# Patient Record
Sex: Female | Born: 1960 | Race: White | Hispanic: No | Marital: Married | Smoking: Never smoker
Health system: Southern US, Community
[De-identification: ages and names within clinical notes are randomized; demographics above are authoritative.]

## PROBLEM LIST (undated history)

## (undated) DIAGNOSIS — K5792 Diverticulitis of intestine, part unspecified, without perforation or abscess without bleeding: Secondary | ICD-10-CM

## (undated) DIAGNOSIS — E7800 Pure hypercholesterolemia, unspecified: Secondary | ICD-10-CM

## (undated) DIAGNOSIS — Z8249 Family history of ischemic heart disease and other diseases of the circulatory system: Secondary | ICD-10-CM

## (undated) DIAGNOSIS — G43909 Migraine, unspecified, not intractable, without status migrainosus: Secondary | ICD-10-CM

## (undated) DIAGNOSIS — I10 Essential (primary) hypertension: Secondary | ICD-10-CM

## (undated) DIAGNOSIS — M1990 Unspecified osteoarthritis, unspecified site: Secondary | ICD-10-CM

## (undated) DIAGNOSIS — C801 Malignant (primary) neoplasm, unspecified: Secondary | ICD-10-CM

## (undated) HISTORY — PX: REFRACTIVE SURGERY: SHX103

## (undated) HISTORY — PX: OTHER SURGICAL HISTORY: SHX169

## (undated) HISTORY — DX: Family history of ischemic heart disease and other diseases of the circulatory system: Z82.49

## (undated) HISTORY — PX: HERNIA REPAIR: SHX51

## (undated) HISTORY — DX: Diverticulitis of intestine, part unspecified, without perforation or abscess without bleeding: K57.92

## (undated) HISTORY — DX: Migraine, unspecified, not intractable, without status migrainosus: G43.909

## (undated) HISTORY — DX: Pure hypercholesterolemia, unspecified: E78.00

## (undated) HISTORY — DX: Essential (primary) hypertension: I10

---

## 2000-05-31 ENCOUNTER — Ambulatory Visit (HOSPITAL_COMMUNITY): Admission: RE | Admit: 2000-05-31 | Discharge: 2000-05-31 | Payer: Self-pay | Admitting: Internal Medicine

## 2002-04-13 ENCOUNTER — Other Ambulatory Visit: Admission: RE | Admit: 2002-04-13 | Discharge: 2002-04-13 | Payer: Self-pay | Admitting: Obstetrics and Gynecology

## 2002-04-19 ENCOUNTER — Encounter: Admission: RE | Admit: 2002-04-19 | Discharge: 2002-04-19 | Payer: Self-pay | Admitting: Obstetrics and Gynecology

## 2002-04-19 ENCOUNTER — Encounter: Payer: Self-pay | Admitting: Obstetrics and Gynecology

## 2003-04-25 ENCOUNTER — Other Ambulatory Visit: Admission: RE | Admit: 2003-04-25 | Discharge: 2003-04-25 | Payer: Self-pay | Admitting: Obstetrics and Gynecology

## 2004-05-07 ENCOUNTER — Other Ambulatory Visit: Admission: RE | Admit: 2004-05-07 | Discharge: 2004-05-07 | Payer: Self-pay | Admitting: Obstetrics and Gynecology

## 2004-12-02 ENCOUNTER — Encounter (INDEPENDENT_AMBULATORY_CARE_PROVIDER_SITE_OTHER): Payer: Self-pay | Admitting: *Deleted

## 2004-12-02 ENCOUNTER — Ambulatory Visit (HOSPITAL_COMMUNITY): Admission: RE | Admit: 2004-12-02 | Discharge: 2004-12-02 | Payer: Self-pay | Admitting: Gastroenterology

## 2004-12-15 ENCOUNTER — Ambulatory Visit: Payer: Self-pay | Admitting: Internal Medicine

## 2005-04-15 ENCOUNTER — Encounter: Admission: RE | Admit: 2005-04-15 | Discharge: 2005-04-15 | Payer: Self-pay | Admitting: Sports Medicine

## 2005-09-23 ENCOUNTER — Other Ambulatory Visit: Admission: RE | Admit: 2005-09-23 | Discharge: 2005-09-23 | Payer: Self-pay | Admitting: Obstetrics and Gynecology

## 2005-10-30 ENCOUNTER — Ambulatory Visit (HOSPITAL_COMMUNITY): Admission: RE | Admit: 2005-10-30 | Discharge: 2005-10-30 | Payer: Self-pay | Admitting: Obstetrics and Gynecology

## 2005-10-30 ENCOUNTER — Encounter (INDEPENDENT_AMBULATORY_CARE_PROVIDER_SITE_OTHER): Payer: Self-pay | Admitting: Specialist

## 2005-11-24 ENCOUNTER — Ambulatory Visit: Payer: Self-pay | Admitting: Internal Medicine

## 2005-12-09 ENCOUNTER — Ambulatory Visit: Payer: Self-pay | Admitting: Internal Medicine

## 2005-12-10 ENCOUNTER — Ambulatory Visit: Payer: Self-pay | Admitting: Internal Medicine

## 2005-12-10 ENCOUNTER — Encounter: Admission: RE | Admit: 2005-12-10 | Discharge: 2005-12-10 | Payer: Self-pay | Admitting: Sports Medicine

## 2005-12-25 ENCOUNTER — Encounter: Payer: Self-pay | Admitting: Cardiology

## 2005-12-25 ENCOUNTER — Ambulatory Visit: Payer: Self-pay

## 2006-01-22 ENCOUNTER — Ambulatory Visit: Payer: Self-pay | Admitting: Internal Medicine

## 2006-05-13 ENCOUNTER — Ambulatory Visit: Payer: Self-pay | Admitting: Internal Medicine

## 2006-05-20 ENCOUNTER — Ambulatory Visit: Payer: Self-pay | Admitting: Internal Medicine

## 2006-05-25 ENCOUNTER — Ambulatory Visit: Payer: Self-pay | Admitting: Cardiovascular Disease

## 2007-02-21 ENCOUNTER — Ambulatory Visit: Payer: Self-pay | Admitting: Internal Medicine

## 2007-02-25 DIAGNOSIS — E7800 Pure hypercholesterolemia, unspecified: Secondary | ICD-10-CM

## 2007-06-23 ENCOUNTER — Ambulatory Visit: Payer: Self-pay | Admitting: Internal Medicine

## 2007-06-23 LAB — CONVERTED CEMR LAB
ALT: 25 units/L (ref 0–35)
AST: 25 units/L (ref 0–37)
Albumin: 4.4 g/dL (ref 3.5–5.2)
Alkaline Phosphatase: 58 units/L (ref 39–117)
BUN: 11 mg/dL (ref 6–23)
Basophils Absolute: 0 10*3/uL (ref 0.0–0.1)
Basophils Relative: 0.1 % (ref 0.0–1.0)
Bilirubin Urine: NEGATIVE
Bilirubin, Direct: 0.1 mg/dL (ref 0.0–0.3)
Blood in Urine, dipstick: NEGATIVE
CO2: 29 meq/L (ref 19–32)
Calcium: 9.6 mg/dL (ref 8.4–10.5)
Chloride: 107 meq/L (ref 96–112)
Cholesterol: 222 mg/dL (ref 0–200)
Creatinine, Ser: 0.8 mg/dL (ref 0.4–1.2)
Direct LDL: 180.5 mg/dL
Eosinophils Absolute: 0.1 10*3/uL (ref 0.0–0.6)
Eosinophils Relative: 0.8 % (ref 0.0–5.0)
GFR calc Af Amer: 98 mL/min
GFR calc non Af Amer: 81 mL/min
Glucose, Bld: 104 mg/dL — ABNORMAL HIGH (ref 70–99)
Glucose, Urine, Semiquant: NEGATIVE
HCT: 40.1 % (ref 36.0–46.0)
HDL: 29.1 mg/dL — ABNORMAL LOW (ref >39.0)
Hemoglobin: 14 g/dL (ref 12.0–15.0)
Ketones, urine, test strip: NEGATIVE
Lymphocytes Relative: 32.9 % (ref 12.0–46.0)
MCHC: 34.8 g/dL (ref 30.0–36.0)
MCV: 86.9 fL (ref 78.0–100.0)
Monocytes Absolute: 0.6 10*3/uL (ref 0.2–0.7)
Monocytes Relative: 8.7 % (ref 3.0–11.0)
Neutro Abs: 3.9 10*3/uL (ref 1.4–7.7)
Neutrophils Relative %: 57.5 % (ref 43.0–77.0)
Nitrite: NEGATIVE
Platelets: 225 10*3/uL (ref 150–400)
Potassium: 4.1 meq/L (ref 3.5–5.1)
RBC: 4.62 M/uL (ref 3.87–5.11)
RDW: 11.9 % (ref 11.5–14.6)
Sodium: 142 meq/L (ref 135–145)
Specific Gravity, Urine: 1.025
TSH: 1.63 microintl units/mL (ref 0.35–5.50)
Total Bilirubin: 0.7 mg/dL (ref 0.3–1.2)
Total CHOL/HDL Ratio: 7.6
Total Protein: 6.8 g/dL (ref 6.0–8.3)
Triglycerides: 119 mg/dL (ref 0–149)
Urobilinogen, UA: 0.2
VLDL: 24 mg/dL (ref 0–40)
WBC Urine, dipstick: NEGATIVE
WBC: 6.8 10*3/uL (ref 4.5–10.5)
pH: 5.5

## 2007-06-30 ENCOUNTER — Ambulatory Visit: Payer: Self-pay | Admitting: Internal Medicine

## 2007-06-30 DIAGNOSIS — G43709 Chronic migraine without aura, not intractable, without status migrainosus: Secondary | ICD-10-CM | POA: Insufficient documentation

## 2007-06-30 DIAGNOSIS — G43009 Migraine without aura, not intractable, without status migrainosus: Secondary | ICD-10-CM | POA: Insufficient documentation

## 2007-07-05 DIAGNOSIS — M25469 Effusion, unspecified knee: Secondary | ICD-10-CM | POA: Insufficient documentation

## 2007-07-08 ENCOUNTER — Ambulatory Visit: Payer: Self-pay | Admitting: Family Medicine

## 2007-09-08 ENCOUNTER — Telehealth: Payer: Self-pay | Admitting: Internal Medicine

## 2007-10-28 ENCOUNTER — Ambulatory Visit: Payer: Self-pay | Admitting: Internal Medicine

## 2007-10-28 DIAGNOSIS — J019 Acute sinusitis, unspecified: Secondary | ICD-10-CM | POA: Insufficient documentation

## 2007-11-07 ENCOUNTER — Telehealth: Payer: Self-pay | Admitting: Internal Medicine

## 2007-12-01 ENCOUNTER — Encounter: Admission: RE | Admit: 2007-12-01 | Discharge: 2007-12-01 | Payer: Self-pay | Admitting: Internal Medicine

## 2007-12-20 ENCOUNTER — Ambulatory Visit: Admission: RE | Admit: 2007-12-20 | Discharge: 2007-12-20 | Payer: Self-pay | Admitting: Internal Medicine

## 2008-12-31 ENCOUNTER — Ambulatory Visit (HOSPITAL_COMMUNITY): Admission: RE | Admit: 2008-12-31 | Discharge: 2008-12-31 | Payer: Self-pay | Admitting: Obstetrics and Gynecology

## 2010-05-21 ENCOUNTER — Encounter: Admission: RE | Admit: 2010-05-21 | Discharge: 2010-05-21 | Payer: Self-pay | Admitting: Neurology

## 2010-05-23 ENCOUNTER — Encounter: Admission: RE | Admit: 2010-05-23 | Discharge: 2010-05-23 | Payer: Self-pay | Admitting: Neurology

## 2010-06-10 ENCOUNTER — Encounter: Admission: RE | Admit: 2010-06-10 | Discharge: 2010-06-10 | Payer: Self-pay | Admitting: Internal Medicine

## 2010-06-16 ENCOUNTER — Encounter: Admission: RE | Admit: 2010-06-16 | Discharge: 2010-06-16 | Payer: Self-pay | Admitting: Internal Medicine

## 2010-07-01 ENCOUNTER — Other Ambulatory Visit: Admission: RE | Admit: 2010-07-01 | Discharge: 2010-07-01 | Payer: Self-pay | Admitting: Interventional Radiology

## 2010-07-01 ENCOUNTER — Encounter: Admission: RE | Admit: 2010-07-01 | Discharge: 2010-07-01 | Payer: Self-pay | Admitting: Endocrinology

## 2010-09-21 ENCOUNTER — Encounter: Payer: Self-pay | Admitting: Neurology

## 2010-12-09 LAB — CBC
HCT: 41.1 % (ref 36.0–46.0)
Hemoglobin: 14 g/dL (ref 12.0–15.0)
MCHC: 34.1 g/dL (ref 30.0–36.0)
MCV: 88.8 fL (ref 78.0–100.0)
RDW: 13.2 % (ref 11.5–15.5)

## 2010-12-17 ENCOUNTER — Other Ambulatory Visit: Payer: Self-pay | Admitting: Endocrinology

## 2010-12-17 DIAGNOSIS — E049 Nontoxic goiter, unspecified: Secondary | ICD-10-CM

## 2010-12-22 ENCOUNTER — Ambulatory Visit
Admission: RE | Admit: 2010-12-22 | Discharge: 2010-12-22 | Disposition: A | Discharge status: Home / Self Care | Payer: BC Managed Care – PPO | Source: Ambulatory Visit | Attending: Endocrinology | Admitting: Endocrinology

## 2010-12-22 DIAGNOSIS — E049 Nontoxic goiter, unspecified: Secondary | ICD-10-CM

## 2011-01-13 NOTE — Op Note (Signed)
NAMEGRACELYN, Sherry Fernandez                 ACCOUNT NO.:  [id number]   MEDICAL RECORD NO.:  [id number]          PATIENT TYPE:  AMB   LOCATION:  SDC                           FACILITY:  [facility]   PHYSICIAN:  Duke Salvia. Marcelle Overlie, M.D.DATE OF BIRTH:  [birth year]   DATE OF PROCEDURE:  DATE OF DISCHARGE:                               OPERATIVE REPORT   PREOPERATIVE DIAGNOSIS:  Stress urinary incontinence.   POSTOPERATIVE DIAGNOSIS:  Stress urinary incontinence.   PROCEDURE:  Solyx single incision, mid urethral sling, cystoscopy.   SURGEON:  Duke Salvia. Marcelle Overlie, MD   ANESTHESIA:  General.   COMPLICATIONS:  None.   DRAINS:  Foley catheter.   SPECIMENS:  None.   BLOOD LOSS:  Less than 50 mL.   PROCEDURE AND FINDINGS:  The patient was taken to the operating room.  After an adequate level of general anesthesia was obtained with the  patient's legs in stirrups, the perineum and vagina were prepped and  draped in usual manner for vaginal procedures.  In-and-out cath  performed.  Weighted speculum was positioned.  The mid urethral area was  identified, infiltrated with 1% Xylocaine local into the area of  dissection.  A 2.5-cm vertical mid urethral incision was made.  This was  grasped with Allis clamps on either side.  Minimal sharp dissection and  then blunt dissection with the surgeon's finger until the posterior side  of the inferior pubic ramus could be palpated on each side.  The Solyx  SIS was then loaded at a 45-degree angle, was positioned to the midpoint  behind the inferior pubic ramus on her right.  This was then anchored.  The exact same repeated on the opposite side.  Prior to disengaging the  anchor, the tension was checked for the appropriate level and the anchor  was disengaged.  Incision was closed with interrupted 2-0 Vicryl  sutures.  Cystoscopy was then carried out with a 70-degrees revealing  intact bladder and urethra.  Vaginal pack was positioned.  Foley  catheter positioned  draining clear urine.  She tolerated this well, went  to recovery room in good condition.       Richard M. Marcelle Overlie, M.D.  Electronically Signed    RMH/MEDQ  D:  12/31/2008  T:  12/31/2008  Job:  [id number]

## 2011-01-13 NOTE — H&P (Signed)
Sherry Fernandez, Sherry Fernandez                 ACCOUNT NO.:  [id number]   MEDICAL RECORD NO.:  [id number]        PATIENT TYPE:  WAMB   LOCATION:                                FACILITY:  [facility]   PHYSICIAN:  Duke Salvia. Marcelle Overlie, M.D.DATE OF BIRTH:  1960/12/24   DATE OF ADMISSION:  12/31/2008  DATE OF DISCHARGE:                              HISTORY & PHYSICAL   CHIEF COMPLAINT:  Stress urinary incontinence.   HISTORY OF PRESENT ILLNESS:  A 50 year old G3, P3, partner had a  vasectomy with a history of leaking urine with exercise, coughing,  laughing, sneezing.   Urodynamic evaluation in our office demonstrated a PVR 25 mL.  Her leak  point was 99-134.  She definitely had leaking with a full bladder.  The  Solyx SIS procedure reviewed with her including risks related to  bleeding, infection, mesh erosion, the possible need for postop  catheterization, bladder irritability.  Symptoms all reviewed with her  which she understands and accepts.   PAST MEDICAL HISTORY:  Allergies to SULFA.   OPERATIONS:  Vaginal delivery x3, herniorrhaphy 1983, endometrial  ablation 2007, and endoscopy March 2009.   REVIEW OF SYSTEMS:  Significant for history of headache.   FAMILY HISTORY:  Otherwise unremarkable.   SOCIAL HISTORY:  Denies smoking or drug use, 0-2 drinks per day for  alcohol use.  Her primary care physician is Dr. Merri Brunette.   PHYSICAL EXAMINATION:  VITAL SIGNS:  Temperature 92, blood pressure  120/78.  HEENT:  Unremarkable.  NECK:  Supple without masses.  LUNGS:  Clear.  CARDIOVASCULAR:  Regular rate and rhythm without murmurs, rubs, or  gallops.  BREASTS:  Without masses.  ABDOMEN:  Soft, flat, nontender.  PELVIC:  Normal external genitalia.  High vaginal swab is clear.  Uterus  is normal in positional size.  Adnexa negative.  EXTREMITIES:  Unremarkable.  NEUROLOGIC:  Unremarkable.   IMPRESSION:  Stress urinary incontinence.   PLAN:  Solyx Single Incision Sling.  Procedure and risks  reviewed as  above.      Richard M. Marcelle Overlie, M.D.  Electronically Signed     RMH/MEDQ  D:  12/25/2008  T:  12/26/2008  Job:  [id number]

## 2011-01-16 NOTE — Op Note (Signed)
NAMEKEMIAH, BOOZ                 ACCOUNT NO.:  [id number]   MEDICAL RECORD NO.:  [id number]          PATIENT TYPE:  AMB   LOCATION:  SDC                           FACILITY:  [facility]   PHYSICIAN:  Duke Salvia. Marcelle Overlie, M.D.DATE OF BIRTH:  1960/12/22   DATE OF PROCEDURE:  10/30/2005  DATE OF DISCHARGE:                                 OPERATIVE REPORT   PREOPERATIVE DIAGNOSES:  Endometrial polyp, abnormal uterine bleeding.   POSTOPERATIVE DIAGNOSES:  Endometrial polyp, abnormal uterine bleeding.   PROCEDURE:  D&C hysteroscopy with NovaSure endometrial ablation.   SPECIMENS:  Endometrial curetting's.   ESTIMATED BLOOD LOSS:  Minimal.   SURGEON:  Duke Salvia. Marcelle Overlie, M.D.   DESCRIPTION OF PROCEDURE:  The patient was taken to the operating room and  after an adequate level of general endotracheal anesthesia was obtained with  the legs in stirrups, the perineum and vagina were prepped and draped in the  usual manner for vaginal procedures. The bladder was drained, EUA carried  out, the uterus mid position, normal size, adnexa negative. The speculum was  positioned, cervix grasped with a tenaculum, paracervical block created by  infiltrating at 3 and 9 o'clock submucosally 5-7 mL of 1% Xylocaine on  either side after negative aspiration. The uterus was sounded to 8 cm with a  cervical length of 2.5, progressively dilated to a 29 Pratt dilator. A polyp  forceps used to explore the cavity revealing 1 polypoid mass, a D&C was  carried out, minimal tissue sent for pathology. The scope was inserted, the  cavity was irrigated, noted to otherwise have a normal contour. After this  was noted, the NovaSure procedure was carried out per protocol after passing  the CO2 test. Power of  121 for 68 seconds without any immediate  complications. She did receive IV Toradol at the end of the case and went to  the recovery room in good condition.      Richard M. Marcelle Overlie, M.D.  Electronically  Signed     RMH/MEDQ  D:  10/30/2005  T:  10/30/2005  Job:  [id number]

## 2011-01-16 NOTE — H&P (Signed)
Sherry Fernandez, Sherry Fernandez                 ACCOUNT NO.:  [id number]   MEDICAL RECORD NO.:  [id number]          PATIENT TYPE:  AMB   LOCATION:  SDC                           [facility]ILITY:  [facility]   PHYSICIAN:  Duke Salvia. Marcelle Overlie, M.D.DATE OF BIRTH:  [birth year]   DATE OF ADMISSION:  10/30/2005  DATE OF DISCHARGE:                                HISTORY & PHYSICAL   CHIEF COMPLAINT:  Menorrhagia.   HISTORY OF PRESENT ILLNESS:  50 year old G4, P3.  Her husband has had a  vasectomy.  She presents with menorrhagia.  Has had a prior HSG that showed  several very small fibroids and a possibility of an endometrial polyp.  She  presents now for [facility], hysteroscopy, and NovaSure EMA.  This procedure  including risks of bleeding, infection, adjacent organ injury, the possible  need for open or additional surgery all reviewed with her which she  understands and accepts.   PAST MEDICAL HISTORY:   ALLERGIES:  None.   OPERATIONS:  Prior hernia repair.   OBSTETRICAL HISTORY:  Three vaginal deliveries at term without complication  one of which she had gestational diabetes.   PHYSICAL EXAMINATION:  VITAL SIGNS:  Temperature 98.2, blood pressure 98/72.  HEENT:  Unremarkable.  NECK:  Supple without mass.  LUNGS:  Clear.  CARDIOVASCULAR:  Regular rate and rhythm without murmurs, rubs, or gallops  noted.  BREASTS:  Negative.  ABDOMEN:  Benign.  PELVIC:  Normal external genitalia, vagina and cervix clear.  Uterus mid  position, normal size.  Adnexa negative.  EXTREMITIES:  Unremarkable.  NEUROLOGIC:  Unremarkable.   IMPRESSION:  Menorrhagia.   PLAN:  D&C, hysteroscopy, NovaSure endometrial ablation.  Procedure and  risks reviewed as above.      Richard M. Marcelle Overlie, M.D.  Electronically Signed    RMH/MEDQ  D:  10/22/2005  T:  10/22/2005  Job:  [id number]

## 2011-01-16 NOTE — Assessment & Plan Note (Signed)
[facility]                                   ON-CALL NOTE   KINSIE, BELFORD                          MRN:          [id number]  DATE:05/19/2006                            DOB:          [birth year]    ON CALL NOTE:  8 p.m.  Caller is patient's husband [employer].  The phone  number [phone].   SUBJECTIVE:  Mr. Kunde states that his wife has been having severe headache  off and on for the past 10 days. She has a history of migraines.  She has an  appointment scheduled tomorrow with Dr. Cato Mulligan.  He states that she has had  a severe headache that returned this morning, accompanied by photo and  phonophobia.  She did have nausea and vomiting once in the previous few days  and now is just nauseous.  She states to him that she does not have any  numbness, slurred speech, visual change, weakness of any limbs.  She also  does not have any fever, chills, shortness of breath.  A short time ago she  took a hydrocodone and ibuprofen that she had been given previously.  Her  headache reduced from a 10/10 pain down to 5/10 pain.  Mr. Cannell main  question is whether she should be evaluated tonight at the emergency room or  wait until the appointment tomorrow.   PLAN:  The patient's question and the patient's husband's question was  discussed in detail.  We discussed the possibility of the need to go to  Urgent Care for further pain medication.  The patient was instructed through  the patient's husband that if any numbness, slurred speech, fever or severe  headache return to go to the emergency room for further evaluation tonight.  Otherwise she should keep her appointment tomorrow with Dr. Cato Mulligan.                                   Kerby Nora, MD   AB/MedQ  DD:  05/19/2006  DT:  05/21/2006  Job #:  [id number]   cc:   Valetta Mole. Swords, MD

## 2011-01-16 NOTE — Op Note (Signed)
Sherry Fernandez, CELLI                  ACCOUNT NO.:  [id number]   MEDICAL RECORD NO.:  [id number]          PATIENT TYPE:  AMB   LOCATION:  ENDO                         FACILITY:  [facility]   PHYSICIAN:  Petra Kuba, M.D.    DATE OF BIRTH:  [birth year]   DATE OF PROCEDURE:  12/02/2004  DATE OF DISCHARGE:                                 OPERATIVE REPORT   PROCEDURE:  Colonoscopy with biopsy.   INDICATIONS:  Change in bowel habits, almost due for colonic screening.  Long history of IBS.  Want to re-evaluate.  Consent was signed after risks,  benefits, methods, and options thoroughly discussed in the office.   MEDICATIONS:  Demerol 100 mg, Versed 10 mg.   PROCEDURE:  Rectal inspection was pertinent for external hemorrhoids, small.  Digital exam was negative.  The video pediatric adjustable colonoscope was  inserted and despite a tortuous sigmoid, was able to be advanced to the  cecum, which required abdominal pressure but no position changes.  No  abnormalities were seen on insertion.  The scope was inserted a short way  into the terminal ileum, which was normal.  Photo documentation was obtained  and the scope was slowly withdrawn.  Prep was adequate.  There was some  liquid stool that required washing and suctioning.  On slow withdrawal  through the colon, the cecum, ascending and transverse was normal, as was  the majority of the descending.  As the scope was withdrawn in the tortuous  sigmoid, a few tiny early diverticula were seen.  In the distal sigmoid, a  questionable tiny polyp was seen versus a normal fold, which was cold  biopsied x2.  No other abnormalities were seen as we slowly withdrew back to  the rectum.  Anorectal pull-through and retroflexion confirmed some small  hemorrhoids.  The scope was straightened and readvanced a short way up the  left side of the colon, air was suctioned, the scope removed.  The patient  tolerated the procedure well.  There was no obvious immediate  complication.   ENDOSCOPIC DIAGNOSES:  1.  Internal-external small hemorrhoids.  2.  Tortuous sigmoid.  3.  Early few left-sided diverticula.  4.  A normal sigmoid fold versus tiny polyp, cold biopsied.  5.  Otherwise within normal limits to the terminal ileum.   PLAN:  Await pathology.  Would follow up p.r.n. or in two months.  Continue  __________ since it seems to be helping.      MEM/MEDQ  D:  12/02/2004  T:  12/02/2004  Job:  [id number]   cc:   Valetta Mole. Swords, M.D. [facility]   Richard M. Marcelle Overlie, M.D.  [address] [place]  [place] [address]  Fax: [phone]

## 2011-06-05 IMAGING — US US CAROTID DUPLEX BILAT
1 series · 13 of 24 positions shown · non-contrast
Comparison: 05/23/2010 neck CT

CLINICAL DATA: Migraines, left vertebral narrowing at its origin

BILATERAL CAROTID DUPLEX ULTRASOUND
TECHNIQUE: Gray scale imaging, color Doppler and duplex ultrasound
was performed of bilateral carotid and vertebral arteries in the
neck.

[Series 1: us carotid duplex bilat · 0.07mm/px · 13 of 62 slices shown]
[im 1/62]
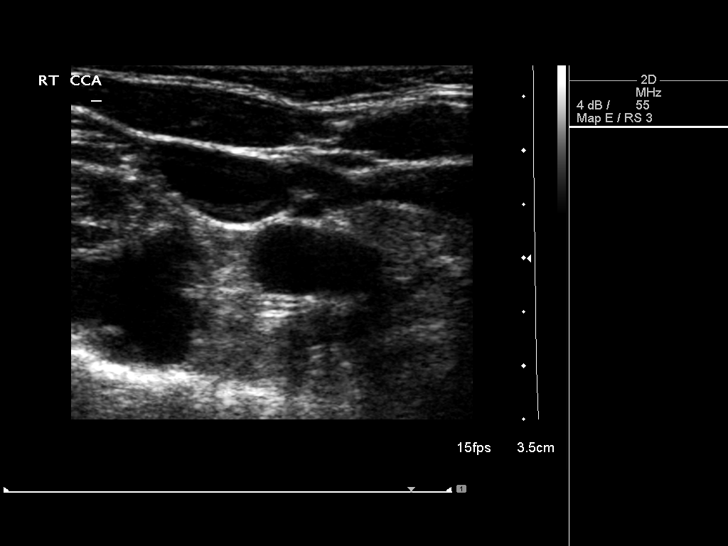
[im 6/62]
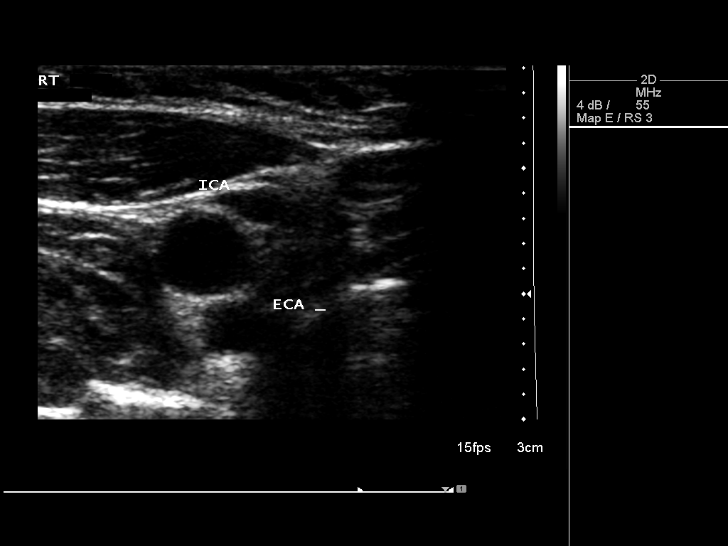
[im 11/62]
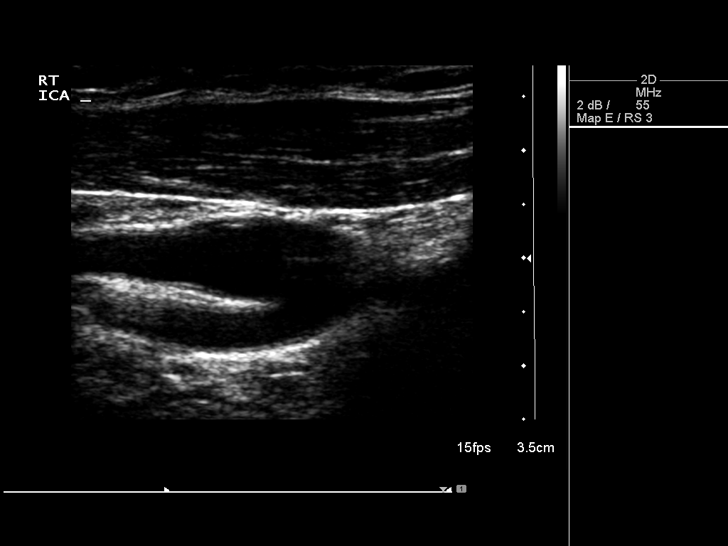
[im 16/62]
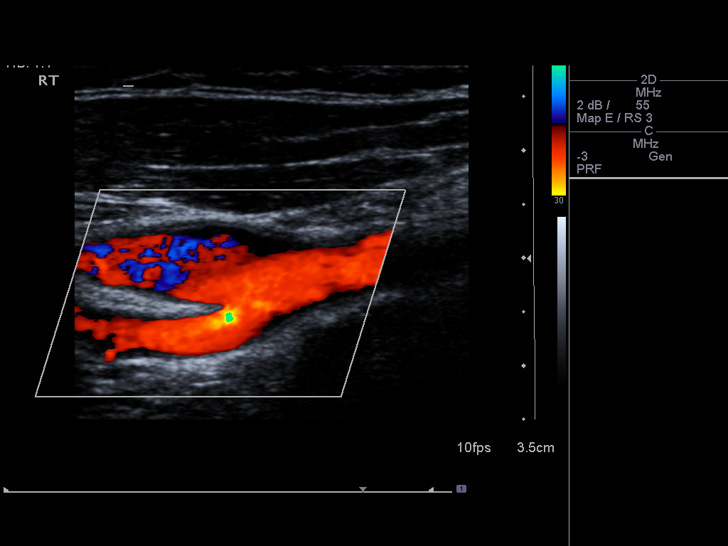
[im 22/62]
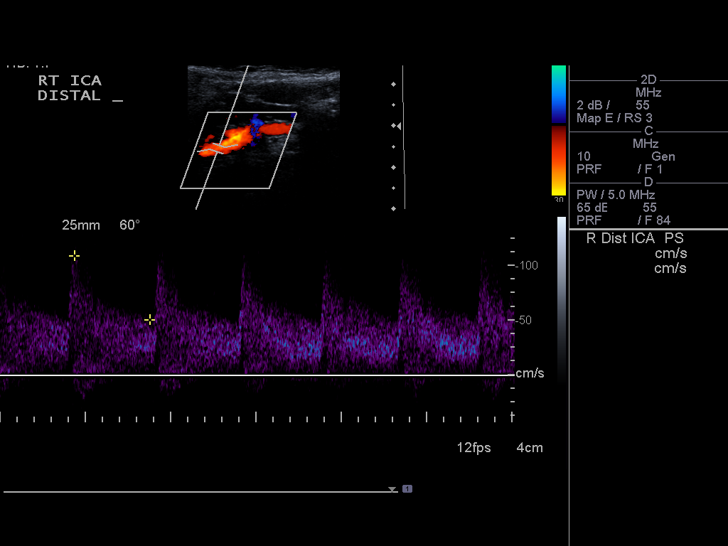
[im 27/62]
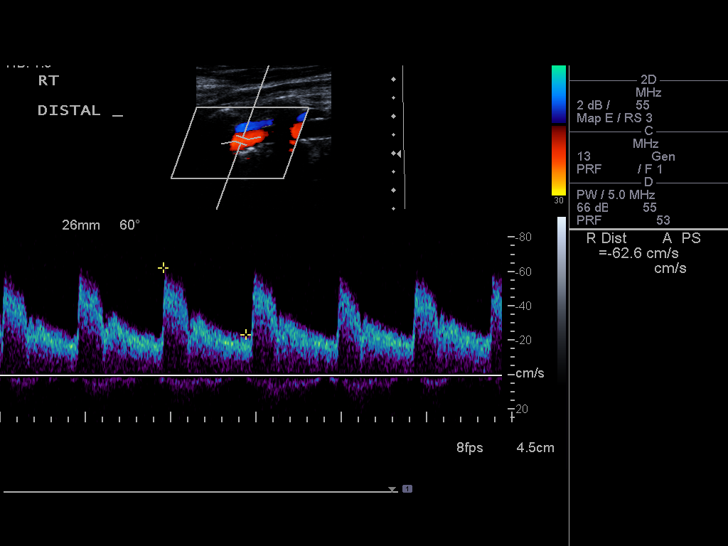
[im 32/62]
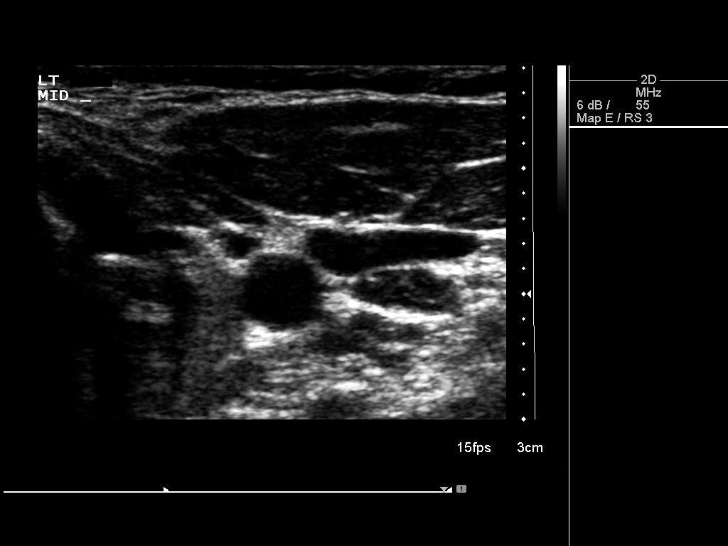
[im 35/62]
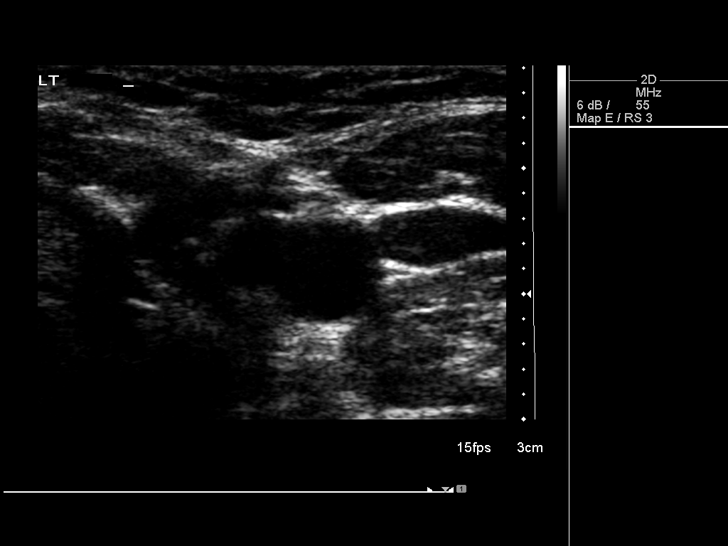
[im 40/62]
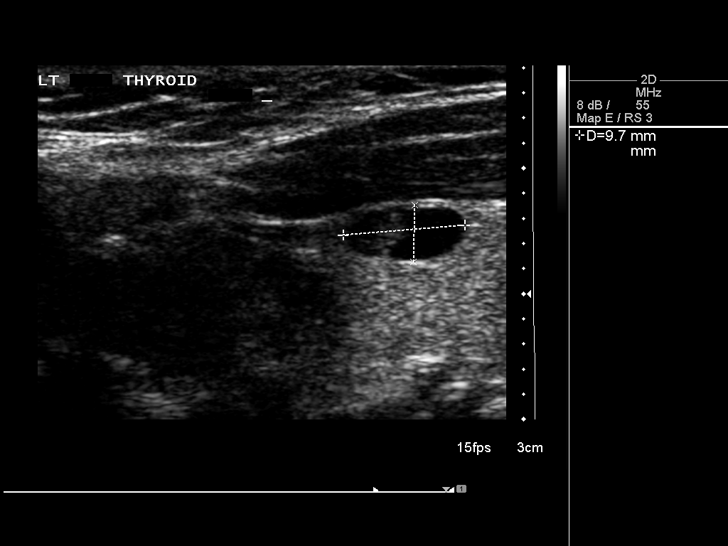
[im 46/62]
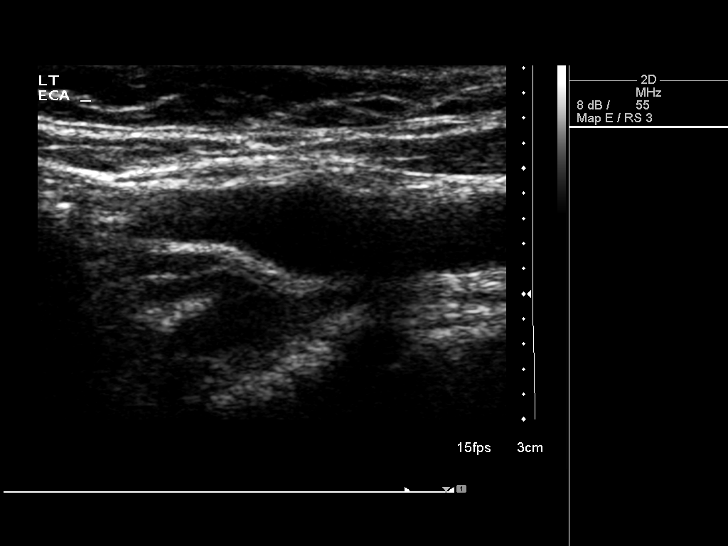
[im 51/62]
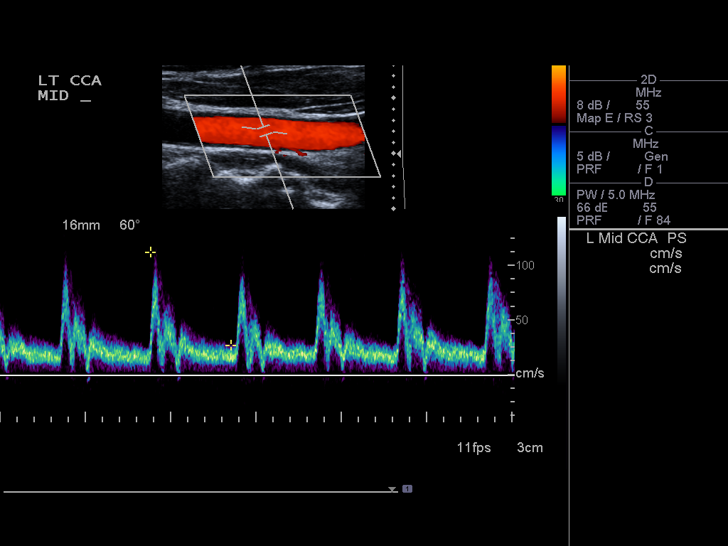
[im 56/62]
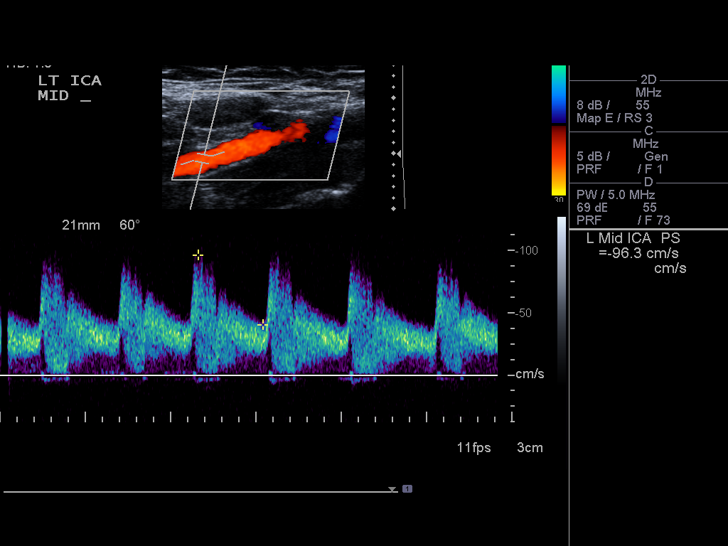
[im 62/62]
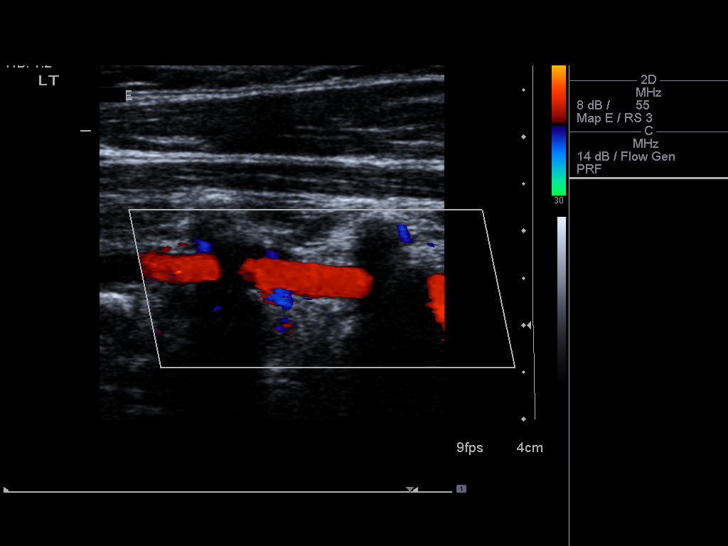

[13 of 24 positions shown; findings below may reference images not displayed]

Criteria:  Quantification of carotid stenosis is based on velocity
parameters that correlate the residual internal carotid diameter
with NASCET-based stenosis levels, using the diameter of the distal
internal carotid lumen as the denominator for stenosis measurement.

The following velocity measurements were obtained:

                 PEAK SYSTOLIC/END DIASTOLIC
RIGHT
ICA:                        190/51cm/sec
CCA:                        115/28cm/sec
SYSTOLIC ICA/CCA RATIO:
DIASTOLIC ICA/CCA RATIO:
ECA:                        116/21cm/sec

LEFT
ICA:                        101/38cm/sec
CCA:                        113/27cm/sec
SYSTOLIC ICA/CCA RATIO:
DIASTOLIC ICA/CCA RATIO:
ECA:                        88/15cm/sec
FINDINGS: RIGHT CAROTID ARTERY: No significant right carotid atherosclerosis.
No hemodynamically significant ICA stenosis, velocity elevation, or
turbulent flow.

RIGHT VERTEBRAL ARTERY:  Antegrade

LEFT CAROTID ARTERY: Minimal intimal thickening.  No significant
plaque formation.  No hemodynamically significant ICA stenosis,
velocity elevation, or turbulent flow.

LEFT VERTEBRAL ARTERY:  Antegrade

Incidental left thyroid complex cystic nodule noted measuring 10
mm.
IMPRESSION: No hemodynamically significant ICA stenosis on either side.
Both vertebral arteries are patent with antegrade flow.

## 2012-04-05 ENCOUNTER — Ambulatory Visit (INDEPENDENT_AMBULATORY_CARE_PROVIDER_SITE_OTHER): Payer: Self-pay | Admitting: Surgery

## 2012-04-06 ENCOUNTER — Telehealth (INDEPENDENT_AMBULATORY_CARE_PROVIDER_SITE_OTHER): Payer: Self-pay

## 2012-04-06 NOTE — Telephone Encounter (Signed)
Returned pt's call. The pt came in to our office yesterday thinking she had an appt with Dr Jamey Ripa for hems and the appt had been r/s to Dr Dwain Sarna for the 8/12 but no one contacted her to tell the change of the appt's. The pt was not happy with driving all the way over here to find out her appt had been moved. I explained to her that Dr Jamey Ripa does not see pt's with hemorrhoid disease and that's why the appt got changed to another doctor. I don't know why she didn't get notified of the change but I apologized. The appt for Monday is with Dr Dwain Sarna and he does see pt's with hemorrhoid disease. The pt wants to cancel the appt for Monday b/c she is taking her kid to college and she will have to r/s the appt. I just told her to call back and make the appt again with Dr Dwain Sarna when her schedule is better. I gave the pt my name if she has any problems making the next appt with our office. The pt understands.

## 2012-04-11 ENCOUNTER — Ambulatory Visit (INDEPENDENT_AMBULATORY_CARE_PROVIDER_SITE_OTHER): Payer: Self-pay | Admitting: General Surgery

## 2013-01-20 ENCOUNTER — Other Ambulatory Visit: Payer: Self-pay | Admitting: Internal Medicine

## 2013-01-20 DIAGNOSIS — I6509 Occlusion and stenosis of unspecified vertebral artery: Secondary | ICD-10-CM

## 2013-01-20 DIAGNOSIS — E049 Nontoxic goiter, unspecified: Secondary | ICD-10-CM

## 2013-02-02 ENCOUNTER — Other Ambulatory Visit: Payer: BC Managed Care – PPO

## 2013-02-02 ENCOUNTER — Ambulatory Visit
Admission: RE | Admit: 2013-02-02 | Discharge: 2013-02-02 | Disposition: A | Discharge status: Home / Self Care | Payer: BC Managed Care – PPO | Source: Ambulatory Visit | Attending: Internal Medicine | Admitting: Internal Medicine

## 2013-02-02 DIAGNOSIS — I6509 Occlusion and stenosis of unspecified vertebral artery: Secondary | ICD-10-CM

## 2013-02-02 DIAGNOSIS — E049 Nontoxic goiter, unspecified: Secondary | ICD-10-CM

## 2013-09-20 ENCOUNTER — Other Ambulatory Visit: Payer: Self-pay | Admitting: Orthopaedic Surgery

## 2013-09-20 DIAGNOSIS — M25561 Pain in right knee: Secondary | ICD-10-CM

## 2013-09-23 ENCOUNTER — Ambulatory Visit
Admission: RE | Admit: 2013-09-23 | Discharge: 2013-09-23 | Disposition: A | Discharge status: Home / Self Care | Payer: BC Managed Care – PPO | Source: Ambulatory Visit | Attending: Orthopaedic Surgery | Admitting: Orthopaedic Surgery

## 2013-09-23 DIAGNOSIS — M25561 Pain in right knee: Secondary | ICD-10-CM

## 2014-05-29 ENCOUNTER — Other Ambulatory Visit: Payer: Self-pay | Admitting: Obstetrics and Gynecology

## 2014-05-30 LAB — CYTOLOGY - PAP

## 2015-11-23 ENCOUNTER — Encounter (HOSPITAL_COMMUNITY): Payer: Self-pay | Admitting: Emergency Medicine

## 2015-11-23 ENCOUNTER — Emergency Department (HOSPITAL_COMMUNITY): Payer: BLUE CROSS/BLUE SHIELD

## 2015-11-23 ENCOUNTER — Emergency Department (HOSPITAL_COMMUNITY)
Admission: EM | Admit: 2015-11-23 | Discharge: 2015-11-23 | Disposition: A | Discharge status: Home / Self Care | Payer: BLUE CROSS/BLUE SHIELD | Attending: Emergency Medicine | Admitting: Emergency Medicine

## 2015-11-23 DIAGNOSIS — R5383 Other fatigue: Secondary | ICD-10-CM | POA: Insufficient documentation

## 2015-11-23 DIAGNOSIS — R05 Cough: Secondary | ICD-10-CM | POA: Diagnosis not present

## 2015-11-23 DIAGNOSIS — R51 Headache: Secondary | ICD-10-CM | POA: Diagnosis not present

## 2015-11-23 DIAGNOSIS — Z79899 Other long term (current) drug therapy: Secondary | ICD-10-CM | POA: Diagnosis not present

## 2015-11-23 DIAGNOSIS — R55 Syncope and collapse: Secondary | ICD-10-CM | POA: Diagnosis present

## 2015-11-23 DIAGNOSIS — M791 Myalgia: Secondary | ICD-10-CM | POA: Insufficient documentation

## 2015-11-23 DIAGNOSIS — Z791 Long term (current) use of non-steroidal anti-inflammatories (NSAID): Secondary | ICD-10-CM | POA: Diagnosis not present

## 2015-11-23 DIAGNOSIS — R0602 Shortness of breath: Secondary | ICD-10-CM | POA: Diagnosis not present

## 2015-11-23 DIAGNOSIS — R0981 Nasal congestion: Secondary | ICD-10-CM | POA: Insufficient documentation

## 2015-11-23 DIAGNOSIS — R6889 Other general symptoms and signs: Secondary | ICD-10-CM

## 2015-11-23 DIAGNOSIS — R509 Fever, unspecified: Secondary | ICD-10-CM | POA: Diagnosis not present

## 2015-11-23 DIAGNOSIS — J029 Acute pharyngitis, unspecified: Secondary | ICD-10-CM | POA: Diagnosis not present

## 2015-11-23 DIAGNOSIS — Z3202 Encounter for pregnancy test, result negative: Secondary | ICD-10-CM | POA: Diagnosis not present

## 2015-11-23 LAB — BASIC METABOLIC PANEL
ANION GAP: 11 (ref 5–15)
BUN: 14 mg/dL (ref 6–20)
CALCIUM: 9.5 mg/dL (ref 8.9–10.3)
CO2: 25 mmol/L (ref 22–32)
CREATININE: 1.05 mg/dL — AB (ref 0.44–1.00)
Chloride: 106 mmol/L (ref 101–111)
GFR, EST NON AFRICAN AMERICAN: 58 mL/min — AB (ref >60)
Glucose, Bld: 130 mg/dL — ABNORMAL HIGH (ref 65–99)
Potassium: 4.2 mmol/L (ref 3.5–5.1)
Sodium: 142 mmol/L (ref 135–145)

## 2015-11-23 LAB — CBC
HEMATOCRIT: 41.2 % (ref 36.0–46.0)
Hemoglobin: 14 g/dL (ref 12.0–15.0)
MCH: 28.5 pg (ref 26.0–34.0)
MCHC: 34 g/dL (ref 30.0–36.0)
MCV: 83.7 fL (ref 78.0–100.0)
Platelets: 174 10*3/uL (ref 150–400)
RBC: 4.92 MIL/uL (ref 3.87–5.11)
RDW: 12.8 % (ref 11.5–15.5)
WBC: 9.9 10*3/uL (ref 4.0–10.5)

## 2015-11-23 LAB — URINE MICROSCOPIC-ADD ON: RBC / HPF: NONE SEEN RBC/hpf (ref 0–5)

## 2015-11-23 LAB — URINALYSIS, ROUTINE W REFLEX MICROSCOPIC
Bilirubin Urine: NEGATIVE
GLUCOSE, UA: NEGATIVE mg/dL
Hgb urine dipstick: NEGATIVE
KETONES UR: NEGATIVE mg/dL
Nitrite: NEGATIVE
PH: 6 (ref 5.0–8.0)
Protein, ur: NEGATIVE mg/dL
Specific Gravity, Urine: 1.021 (ref 1.005–1.030)

## 2015-11-23 LAB — I-STAT BETA HCG BLOOD, ED (MC, WL, AP ONLY)

## 2015-11-23 LAB — RAPID STREP SCREEN (MED CTR MEBANE ONLY): STREPTOCOCCUS, GROUP A SCREEN (DIRECT): NEGATIVE

## 2015-11-23 LAB — CBG MONITORING, ED: Glucose-Capillary: 113 mg/dL — ABNORMAL HIGH (ref 65–99)

## 2015-11-23 LAB — I-STAT TROPONIN, ED: Troponin i, poc: 0 ng/mL (ref 0.00–0.08)

## 2015-11-23 MED ORDER — DIPHENHYDRAMINE HCL 50 MG/ML IJ SOLN
12.5000 mg | Freq: Once | INTRAMUSCULAR | Status: AC
  Administered 2015-11-23: 12.5 mg via INTRAVENOUS
  Filled 2015-11-23: qty 1

## 2015-11-23 MED ORDER — SODIUM CHLORIDE 0.9 % IV BOLUS (SEPSIS)
1000.0000 mL | Freq: Once | INTRAVENOUS | Status: AC
  Administered 2015-11-23: 1000 mL via INTRAVENOUS

## 2015-11-23 MED ORDER — METOCLOPRAMIDE HCL 5 MG/ML IJ SOLN
10.0000 mg | Freq: Once | INTRAMUSCULAR | Status: AC
  Administered 2015-11-23: 10 mg via INTRAVENOUS
  Filled 2015-11-23: qty 2

## 2015-11-23 MED ORDER — BENZONATATE 100 MG PO CAPS: 100.0000 mg | ORAL_CAPSULE | Freq: Three times a day (TID) | ORAL | Status: DC

## 2015-11-23 MED ORDER — IBUPROFEN 800 MG PO TABS: 800.0000 mg | ORAL_TABLET | Freq: Three times a day (TID) | ORAL | Status: DC

## 2015-11-23 NOTE — ED Notes (Signed)
Per EMS pt complaint of SOB/syncope this morning; recent flu like symptoms onset Monday with cough, fever, and generalized body aches.

## 2015-11-23 NOTE — ED Notes (Signed)
Bed: WA17 Expected date:  Expected time:  Means of arrival:  Comments: EMS- 55 yo, flu-like s/sx, sycope

## 2015-11-23 NOTE — ED Provider Notes (Signed)
CSN: [id number]     Arrival date & time 11/23/15  [id number] History   First MD Initiated Contact with Patient 11/23/15 [phone]     Chief Complaint  Patient presents with  . Near Syncope    HPI   Sherry Fernandez is a 55 y.o. female with no pertinent PMH who presents to the ED with fever, chills, generalized body aches, sore throat, and productive cough since Monday. She reports her symptoms have progressively worsened. She states today, she woke up and felt short of breath. She notes she walked to her recliner and sat down, and her husband came to check on her and she "passed out" for 1-2 minutes. Her husband is present at bedside, who states she lost consciousness though had her eyes open. She states she has tried dayquil and nyquil for her symptoms. She reports sick contact, and states her daughter was recently diagnosed with the flu. She notes she has a history of migraine headaches, and that she has experienced headache over the past week characteristic of her history of migraine headaches.   History reviewed. No pertinent past medical history. History reviewed. No pertinent past surgical history. No family history on file. Social History  Substance Use Topics  . Smoking status: Never Smoker   . Smokeless tobacco: None  . Alcohol Use: Yes   OB History    No data available      Review of Systems  Constitutional: Positive for fever, chills and fatigue.  HENT: Positive for congestion and sore throat.   Respiratory: Positive for cough and shortness of breath.   Cardiovascular: Negative for chest pain.  Gastrointestinal: Negative for nausea, vomiting and abdominal pain.  Musculoskeletal: Positive for myalgias.  Neurological: Positive for syncope and headaches.  All other systems reviewed and are negative.     Allergies  Sulfamethoxazole  Home Medications   Prior to Admission medications   Medication Sig Start Date End Date Taking? Authorizing Provider  dextromethorphan-guaiFENesin  (MUCINEX DM) 30-600 MG 12hr tablet Take 1 tablet by mouth 2 (two) times daily as needed for cough.   Yes Historical Provider, MD  diclofenac (VOLTAREN) 75 MG EC tablet Take 1 tablet by mouth daily. 10/14/15  Yes Historical Provider, MD  DM-Phenylephrine-Acetaminophen (Fernandez DAYQUIL COLD & FLU) 10-5-325 MG/15ML LIQD Take 30 mLs by mouth daily as needed (for cold).   Yes Historical Provider, MD  pantoprazole (PROTONIX) 40 MG tablet Take 40 mg by mouth daily.   Yes Historical Provider, MD  Phenyleph-Doxylamine-DM-APAP (NYQUIL SEVERE COLD/FLU) 5-6.25-10-325 MG/15ML LIQD Take 30 mLs by mouth daily as needed (for cold/sleep).   Yes Historical Provider, MD  promethazine-codeine (PHENERGAN WITH CODEINE) 6.25-10 MG/5ML syrup Take 15 mLs by mouth every 8 (eight) hours as needed for cough.   Yes Historical Provider, MD  rizatriptan (MAXALT) 10 MG tablet Take 10 mg by mouth as needed for migraine. May repeat in 2 hours if needed   Yes Historical Provider, MD  rosuvastatin (CRESTOR) 10 MG tablet Take 5 mg by mouth daily. 10/14/15  Yes Historical Provider, MD  benzonatate (TESSALON) 100 MG capsule Take 1 capsule (100 mg total) by mouth every 8 (eight) hours. 11/23/15   Marella Chimes, PA-C  ibuprofen (ADVIL,MOTRIN) 800 MG tablet Take 1 tablet (800 mg total) by mouth 3 (three) times daily. 11/23/15   Marella Chimes, PA-C    BP 129/52 mmHg  Pulse 82  Temp(Src) 98.1 F (36.7 C) (Oral)  Resp 14  Ht 5\' 4"  (1.626 m)  Wt  70.761 kg  BMI 26.76 kg/m2  SpO2 100% Physical Exam  Constitutional: She is oriented to person, place, and time. She appears well-developed and well-nourished. No distress.  HENT:  Head: Normocephalic and atraumatic.  Right Ear: External ear normal.  Left Ear: External ear normal.  Nose: Nose normal.  Mouth/Throat: Uvula is midline, oropharynx is clear and moist and mucous membranes are normal.  Eyes: Conjunctivae, EOM and lids are normal. Pupils are equal, round, and reactive to  light. Right eye exhibits no discharge. Left eye exhibits no discharge. No scleral icterus.  Neck: Normal range of motion. Neck supple.  Cardiovascular: Normal rate, regular rhythm, normal heart sounds, intact distal pulses and normal pulses.   Pulmonary/Chest: Effort normal and breath sounds normal. No respiratory distress. She has no wheezes. She has no rales.  Abdominal: Soft. Normal appearance and bowel sounds are normal. She exhibits no distension and no mass. There is no tenderness. There is no rigidity, no rebound and no guarding.  Musculoskeletal: Normal range of motion. She exhibits no edema or tenderness.  Neurological: She is alert and oriented to person, place, and time. She has normal strength. No cranial nerve deficit or sensory deficit.  Skin: Skin is warm, dry and intact. No rash noted. She is not diaphoretic. No erythema. No pallor.  Psychiatric: She has a normal mood and affect. Her speech is normal and behavior is normal.  Nursing note and vitals reviewed.   ED Course  Procedures (including critical care time)  Labs Review Labs Reviewed  BASIC METABOLIC PANEL - Abnormal; Notable for the following:    Glucose, Bld 130 (*)    Creatinine, Ser 1.05 (*)    GFR calc non Af Amer 58 (*)    All other components within normal limits  URINALYSIS, ROUTINE W REFLEX MICROSCOPIC (NOT AT [facility]) - Abnormal; Notable for the following:    Leukocytes, UA TRACE (*)    All other components within normal limits  URINE MICROSCOPIC-ADD ON - Abnormal; Notable for the following:    Squamous Epithelial / LPF 0-5 (*)    Bacteria, UA FEW (*)    All other components within normal limits  CBG MONITORING, ED - Abnormal; Notable for the following:    Glucose-Capillary 113 (*)    All other components within normal limits  RAPID STREP SCREEN (NOT AT [facility])  CULTURE, GROUP A STREP ([place])  URINE CULTURE  CBC  I-STAT BETA HCG BLOOD, ED ([facility], [facility], [facility] ONLY)  I-STAT TROPOININ, ED    Imaging Review Dg  Chest 2 View  11/23/2015  CLINICAL DATA:  55 year old female with a history of fall on right side. Cough EXAM: CHEST - 2 VIEW COMPARISON:  None. FINDINGS: Cardiomediastinal silhouette projects within normal limits in size and contour. No confluent airspace disease, pneumothorax, or pleural effusion. No displaced fracture. Unremarkable appearance of the upper abdomen. IMPRESSION: No radiographic evidence of acute cardiopulmonary disease. Signed, Dulcy Fanny. Earleen Newport, DO [facility] Radiology Electronically Signed   By: Corrie Mckusick D.O.   On: 11/23/2015 10:12   I have personally reviewed and evaluated these images and lab results as part of my medical decision-making.   EKG Interpretation   Date/Time:  Saturday November 23 2015 08:45:39 EDT Ventricular Rate:  89 PR Interval:  152 QRS Duration: 88 QT Interval:  402 QTC Calculation: 489 R Axis:   62 Text Interpretation:  Sinus rhythm Borderline prolonged QT interval  Baseline wander in lead(s) I II aVR no wpw, brugada  or prolonged qt No old  tracing to compare Confirmed by FLOYD MD, DANIEL [phone]) on 11/23/2015  11:09:07 AM      MDM   Final diagnoses:  Flu-like symptoms    55 year old female presents with  fever, chills, generalized body aches, sore throat, and productive cough since Monday. Notes shortness of breath and states she passed out this morning. Reports migraine headache similar to history of headaches.  Patient is afebrile. Vital signs stable. No erythema, edema, or exudate to posterior oropharynx. Heart RRR. Lungs clear to auscultation bilaterally. Abdomen soft, non-tender, non-distended. Normal neuro exam with no focal deficit. Patient moves all extremities and ambulates without difficulty.  EKG sinus rhythm, HR 89. Troponin negative. CBC negative for leukocytosis or anemia. BMP remarkable for creatinine 1.05.  UA negative for infection. Beta HCG negative.  Rapid strep negative.  CXR no active cardiopulmonary disease.  Patient reports symptom improvement s/p fluids and migraine meds.  Patient is non-toxic and well-appearing, feel she is stable for discharge at this time. Symptoms likely viral (patient notes sick contact and states her daughter was recently diagnosed with the flu). Will give ibuprofen and tessalon for home for symptoms. Advised to increase fluid intake and to try warm honey, tea, and throat lozenges for additional symptom relief. Patient to follow-up with PCP. Strict return precautions discussed. Patient verbalizes her understanding and is in agreement with plan.  BP 129/52 mmHg  Pulse 82  Temp(Src) 98.1 F (36.7 C) (Oral)  Resp 14  Ht 5\' 4"  (1.626 m)  Wt 70.761 kg  BMI 26.76 kg/m2  SpO2 100%       Marella Chimes, PA-C 11/23/15 [place], DO 11/23/15 1546

## 2015-11-23 NOTE — Discharge Instructions (Signed)
1. Medications: tessalon for cough, ibuprofen for headache and body aches, usual home medications 2. Treatment: rest, drink plenty of fluids; try warm honey, tea, throat lozenges for additional symptom relief 3. Follow Up: please followup with your primary doctor for discussion of your diagnoses and further evaluation after today's visit; if you do not have a primary care doctor use the phone number listed in your discharge paperwork to find one; please return to the ER for high fever, shortness of breath, new or worsening symptoms

## 2015-11-25 LAB — CULTURE, GROUP A STREP (THRC)

## 2015-11-25 LAB — URINE CULTURE: Culture: 1000

## 2016-02-17 DIAGNOSIS — Z0000 Encounter for general adult medical examination without abnormal findings: Secondary | ICD-10-CM | POA: Diagnosis not present

## 2016-02-17 DIAGNOSIS — E039 Hypothyroidism, unspecified: Secondary | ICD-10-CM | POA: Diagnosis not present

## 2016-02-20 ENCOUNTER — Other Ambulatory Visit: Payer: Self-pay | Admitting: Internal Medicine

## 2016-02-20 DIAGNOSIS — Z0000 Encounter for general adult medical examination without abnormal findings: Secondary | ICD-10-CM | POA: Diagnosis not present

## 2016-02-20 DIAGNOSIS — I6522 Occlusion and stenosis of left carotid artery: Secondary | ICD-10-CM

## 2016-03-06 ENCOUNTER — Ambulatory Visit
Admission: RE | Admit: 2016-03-06 | Discharge: 2016-03-06 | Disposition: A | Discharge status: Home / Self Care | Payer: BLUE CROSS/BLUE SHIELD | Source: Ambulatory Visit | Attending: Internal Medicine | Admitting: Internal Medicine

## 2016-03-06 DIAGNOSIS — I6522 Occlusion and stenosis of left carotid artery: Secondary | ICD-10-CM

## 2016-03-06 DIAGNOSIS — I6523 Occlusion and stenosis of bilateral carotid arteries: Secondary | ICD-10-CM | POA: Diagnosis not present

## 2016-05-26 DIAGNOSIS — E7800 Pure hypercholesterolemia, unspecified: Secondary | ICD-10-CM | POA: Diagnosis not present

## 2016-09-03 DIAGNOSIS — K645 Perianal venous thrombosis: Secondary | ICD-10-CM | POA: Diagnosis not present

## 2016-09-25 ENCOUNTER — Ambulatory Visit (INDEPENDENT_AMBULATORY_CARE_PROVIDER_SITE_OTHER): Payer: BLUE CROSS/BLUE SHIELD | Admitting: Cardiovascular Disease

## 2016-09-25 ENCOUNTER — Encounter: Payer: Self-pay | Admitting: Cardiovascular Disease

## 2016-09-25 VITALS — BP 168/97 | HR 65 | Ht 64.0 in | Wt 158.4 lb

## 2016-09-25 DIAGNOSIS — I150 Renovascular hypertension: Secondary | ICD-10-CM

## 2016-09-25 DIAGNOSIS — H43811 Vitreous degeneration, right eye: Secondary | ICD-10-CM | POA: Diagnosis not present

## 2016-09-25 DIAGNOSIS — Z8249 Family history of ischemic heart disease and other diseases of the circulatory system: Secondary | ICD-10-CM

## 2016-09-25 DIAGNOSIS — I10 Essential (primary) hypertension: Secondary | ICD-10-CM | POA: Diagnosis not present

## 2016-09-25 DIAGNOSIS — E7800 Pure hypercholesterolemia, unspecified: Secondary | ICD-10-CM | POA: Diagnosis not present

## 2016-09-25 MED ORDER — AMLODIPINE BESYLATE 10 MG PO TABS: 10.0000 mg | ORAL_TABLET | Freq: Every day | ORAL | 3 refills | Status: DC

## 2016-09-25 NOTE — Progress Notes (Signed)
Cardiology Office Note    Date:  09/25/2016   ID:  SAMAURIA GLAAB, DOB 08/03/1961, MRN [id number]  PCP:  Horatio Pel, MD  Cardiologist:   Sanda Klein, MD   No chief complaint: visual changes, elevated BP for 1 month   History of Present Illness:  Sherry Fernandez is a 56 y.o. female with elevated cholesterol and migraine headaches, but without other chronic medical problems, has noticed that her BP has been elevated (150-170s) for the last month. She was only checking her BP since her husband, Sherry Fernandez, has been adjusting his BP meds recently. Otherwise, she has not had her BP checked since her last physical with Dr. Deland Pretty, about a year ago (when BP was OK).  Yesterday she noticed a large floater in her visual field and she went to see her eye doctor, Luberta Mutter, who was very concerned abut the possibility of retinal damage related to her BP.  She denies dyspnea, angina, palpitations, syncope, edema, hematuria, flank pain, recent headaches, other neuro symptoms.  She has not taken any migraine meds in about a week and a half. She has not recently used NSAIDs.  She was on Crestor (without side effects) until a year ago, but decided to stop it . She just restarted it 2 days ago.  She was briefly treated with Humira when she had knee pain and a large right knee effusion, but she now believes that her joint problems were just tennis related injuries.  Otherwise, Sherry Fernandez is physically active and feels well, "never sick". She avoids high sodium foods and generally eats a healthy diet,  Her father died at age 34 of cardiomyopathy, while waiting for a transplant. Clinical CHF had started about age 62. Her paternal grandmother died suddenly at age 27, without known previous cardiac illness. Her son has pectus carinatum.  Past Medical History:  Diagnosis Date  . Hypercholesterolemia   . Migraine     Past Surgical History:  Procedure Laterality Date  . right knee  arthrocentesis    . thyroid nodule biopsy      Current Medications: Outpatient Medications Prior to Visit  Medication Sig Dispense Refill  . rizatriptan (MAXALT) 10 MG tablet Take 10 mg by mouth as needed for migraine. May repeat in 2 hours if needed    . rosuvastatin (CRESTOR) 10 MG tablet Take 5 mg by mouth daily.  0  . benzonatate (TESSALON) 100 MG capsule Take 1 capsule (100 mg total) by mouth every 8 (eight) hours. (Patient not taking: Reported on 09/25/2016) 21 capsule 0  . dextromethorphan-guaiFENesin (MUCINEX DM) 30-600 MG 12hr tablet Take 1 tablet by mouth 2 (two) times daily as needed for cough.    . diclofenac (VOLTAREN) 75 MG EC tablet Take 1 tablet by mouth daily.  0  . DM-Phenylephrine-Acetaminophen (VICKS DAYQUIL COLD & FLU) 10-5-325 MG/15ML LIQD Take 30 mLs by mouth daily as needed (for cold).    Marland Kitchen ibuprofen (ADVIL,MOTRIN) 800 MG tablet Take 1 tablet (800 mg total) by mouth 3 (three) times daily. (Patient not taking: Reported on 09/25/2016) 21 tablet 0  . pantoprazole (PROTONIX) 40 MG tablet Take 40 mg by mouth daily.    Marland Kitchen Phenyleph-Doxylamine-DM-APAP (NYQUIL SEVERE COLD/FLU) 5-6.25-10-325 MG/15ML LIQD Take 30 mLs by mouth daily as needed (for cold/sleep).    . promethazine-codeine (PHENERGAN WITH CODEINE) 6.25-10 MG/5ML syrup Take 15 mLs by mouth every 8 (eight) hours as needed for cough.     No facility-administered medications prior to visit.  Allergies:   Sulfamethoxazole   Social History   Social History  . Marital status: Married    Spouse name: N/A  . Number of children: N/A  . Years of education: N/A   Social History Main Topics  . Smoking status: Never Smoker  . Smokeless tobacco: Never Used  . Alcohol use Yes  . Drug use: No  . Sexual activity: Not Asked   Other Topics Concern  . None   Social History Narrative  . None     Family History:  The patient's family history includes Heart failure in her father; Sudden death in her paternal  grandmother.   ROS:   Please see the history of present illness.    ROS All other systems reviewed and are negative.   PHYSICAL EXAM:   VS:  BP (!) 168/97 (BP Location: Left Arm)   Pulse 65   Ht 5\' 4"  (1.626 m)   Wt 71.8 kg (158 lb 6.4 oz)   BMI 27.19 kg/m    GEN: Well nourished, well developed, in no acute distress  HEENT: normal  Neck: no JVD, carotid bruits, or masses Cardiac: RRR; no murmurs, rubs, or gallops,no edema  Respiratory:  clear to auscultation bilaterally, normal work of breathing GI: soft, nontender, nondistended, + BS MS: no deformity or atrophy  Skin: warm and dry, no rash Neuro:  Alert and Oriented x 3, Strength and sensation are intact Psych: euthymic mood, full affect  Wt Readings from Last 3 Encounters:  09/25/16 71.8 kg (158 lb 6.4 oz)  11/23/15 70.8 kg (156 lb)  06/30/07 70.8 kg (156 lb)      Studies/Labs Reviewed:   EKG:  EKG is ordered today.  The ekg ordered today demonstrates NSR, normal QT  Recent Labs: 11/23/2015: BUN 14; Creatinine, Ser 1.05; Hemoglobin 14.0; Platelets 174; Potassium 4.2; Sodium 142   Lipid Panel    Component Value Date/Time   CHOL 222 (HH) 06/23/2007 0855   TRIG 119 06/23/2007 0855   HDL 29.1 (L) 06/23/2007 0855   CHOLHDL 7.6 CALC 06/23/2007 0855   VLDL 24 06/23/2007 0855   LDLDIRECT 180.5 06/23/2007 0855    ASSESSMENT:    1. Renovascular hypertension?   2. Hypertension, unspecified type   3. Hypercholesterolemia   4. Family history of cardiomyopathy      PLAN:  In order of problems listed above:  1. HTN: relatively abrupt and late onset of HBP with evidence of rapid end-organ injury (retinopathy) raises the possibility of secondary causes of HTN. Will screen for RAS and check labs. Start amlodipine, but expect she will need more than one agent for BP control. Target BP<130/80. 2. HLP: even in the absence of vascular disease, her LDL is severely elevated and warrants pharmacological Rx. Also has remarkably  low HDL despite being minimally overweight and physically active. Recheck after a few month back on therapy. Target LDL<100 (<70 if we find vascular disease). 3. FHx of heart disease:  It sounds like her dad had nonischemic CMP and his mother had sudden (arrhythmic?) death. I think she should have an echo to assess for possible inheritable CMP.    Medication Adjustments/Labs and Tests Ordered: Current medicines are reviewed at length with the patient today.  Concerns regarding medicines are outlined above.  Medication changes, Labs and Tests ordered today are listed in the Patient Instructions below. Patient Instructions  Medication Instructions: Dr Sallyanne Kuster has recommended making the following medication changes: 1. START Amlodipine 10 mg - take 1 tablet by mouth  daily  Labwork: Your physician recommends that you return for lab work at your convenience.  Testing/Procedures: 1. Echocardiogram - Your physician has requested that you have an echocardiogram. Echocardiography is a painless test that uses sound waves to create images of your heart. It provides your doctor with information about the size and shape of your heart and how well your heart's chambers and valves are working. This procedure takes approximately one hour. There are no restrictions for this procedure. This will be performed at our [place], [address].  2. Renal Artery Duplex - Your physician has requested that you have a renal artery duplex. During this test, an ultrasound is used to evaluate blood flow to the kidneys. Allow one hour for this exam. Do not eat after midnight the day before and avoid carbonated beverages. Take your medications as you usually do. This will be performed here at [facility].  Follow-up: Dr Sallyanne Kuster recommends that you schedule a follow-up appointment in 3 months.  If you need a refill on your cardiac medications before your next appointment, please call your  pharmacy.   Your physician has requested that you regularly monitor and record your blood pressure readings at home . Please use the same machine at the same time of day to check your readings and record them. In two weeks, call to report blood pressures.     Signed, Sanda Klein, MD  09/25/2016 1:31 PM    [facility] [address], [place], [place]  [address] Phone: [phone]; Fax: [phone]

## 2016-09-25 NOTE — Patient Instructions (Signed)
Medication Instructions: Dr Sallyanne Kuster has recommended making the following medication changes: 1. START Amlodipine 10 mg - take 1 tablet by mouth daily  Labwork: Your physician recommends that you return for lab work at your convenience.  Testing/Procedures: 1. Echocardiogram - Your physician has requested that you have an echocardiogram. Echocardiography is a painless test that uses sound waves to create images of your heart. It provides your doctor with information about the size and shape of your heart and how well your heart's chambers and valves are working. This procedure takes approximately one hour. There are no restrictions for this procedure. This will be performed at our [place], [address].  2. Renal Artery Duplex - Your physician has requested that you have a renal artery duplex. During this test, an ultrasound is used to evaluate blood flow to the kidneys. Allow one hour for this exam. Do not eat after midnight the day before and avoid carbonated beverages. Take your medications as you usually do. This will be performed here at [facility].  Follow-up: Dr Sallyanne Kuster recommends that you schedule a follow-up appointment in 3 months.  If you need a refill on your cardiac medications before your next appointment, please call your pharmacy.   Your physician has requested that you regularly monitor and record your blood pressure readings at home . Please use the same machine at the same time of day to check your readings and record them. In two weeks, call to report blood pressures.

## 2016-09-26 LAB — BASIC METABOLIC PANEL
BUN: 13 mg/dL (ref 7–25)
CALCIUM: 9.9 mg/dL (ref 8.6–10.4)
CO2: 25 mmol/L (ref 20–31)
CREATININE: 0.78 mg/dL (ref 0.50–1.05)
Chloride: 105 mmol/L (ref 98–110)
GLUCOSE: 93 mg/dL (ref 65–99)
Potassium: 4.1 mmol/L (ref 3.5–5.3)
SODIUM: 141 mmol/L (ref 135–146)

## 2016-09-30 NOTE — Addendum Note (Signed)
Addended by: Diana Eves on: 09/30/2016 08:29 AM   Modules accepted: Orders

## 2016-10-12 ENCOUNTER — Ambulatory Visit (HOSPITAL_COMMUNITY): Payer: BLUE CROSS/BLUE SHIELD | Attending: Internal Medicine

## 2016-10-12 ENCOUNTER — Other Ambulatory Visit: Payer: Self-pay

## 2016-10-12 DIAGNOSIS — I150 Renovascular hypertension: Secondary | ICD-10-CM | POA: Insufficient documentation

## 2016-10-12 DIAGNOSIS — I517 Cardiomegaly: Secondary | ICD-10-CM | POA: Diagnosis not present

## 2016-10-12 DIAGNOSIS — E7800 Pure hypercholesterolemia, unspecified: Secondary | ICD-10-CM | POA: Diagnosis not present

## 2016-10-13 ENCOUNTER — Ambulatory Visit (HOSPITAL_COMMUNITY)
Admission: RE | Admit: 2016-10-13 | Discharge: 2016-10-13 | Disposition: A | Discharge status: Home / Self Care | Payer: BLUE CROSS/BLUE SHIELD | Source: Ambulatory Visit | Attending: Cardiovascular Disease | Admitting: Cardiovascular Disease

## 2016-10-13 DIAGNOSIS — I150 Renovascular hypertension: Secondary | ICD-10-CM | POA: Diagnosis not present

## 2016-10-14 ENCOUNTER — Telehealth: Payer: Self-pay | Admitting: Cardiovascular Disease

## 2016-10-14 NOTE — Telephone Encounter (Signed)
Discussed results of the echocardiogram and renal artery duplex study. No evidence of renal artery stenosis. Minimum evidence of hypertension related heart changes. Her visual changes are stable. She feels a little sluggish on the amlodipine. Her blood pressure is now consistently normal and in the last 2 days her blood pressure has been 113/69-119/66. I recommended reducing the dose of amlodipine to 5 mg daily. Sanda Klein, MD, [facility] [phone] office [phone] pager

## 2016-10-15 ENCOUNTER — Other Ambulatory Visit: Payer: Self-pay

## 2016-10-15 MED ORDER — AMLODIPINE BESYLATE 5 MG PO TABS: 5.0000 mg | ORAL_TABLET | Freq: Every day | ORAL | 3 refills | Status: DC

## 2016-10-15 NOTE — Addendum Note (Signed)
Addended by: Diana Eves on: 10/15/2016 02:58 PM   Modules accepted: Orders

## 2016-10-20 DIAGNOSIS — H43811 Vitreous degeneration, right eye: Secondary | ICD-10-CM | POA: Diagnosis not present

## 2016-10-26 DIAGNOSIS — H43811 Vitreous degeneration, right eye: Secondary | ICD-10-CM | POA: Diagnosis not present

## 2016-10-26 DIAGNOSIS — H5213 Myopia, bilateral: Secondary | ICD-10-CM | POA: Diagnosis not present

## 2016-11-11 DIAGNOSIS — Z01419 Encounter for gynecological examination (general) (routine) without abnormal findings: Secondary | ICD-10-CM | POA: Diagnosis not present

## 2016-11-11 DIAGNOSIS — Z780 Asymptomatic menopausal state: Secondary | ICD-10-CM | POA: Diagnosis not present

## 2016-11-11 DIAGNOSIS — Z1231 Encounter for screening mammogram for malignant neoplasm of breast: Secondary | ICD-10-CM | POA: Diagnosis not present

## 2016-11-11 DIAGNOSIS — Z131 Encounter for screening for diabetes mellitus: Secondary | ICD-10-CM | POA: Diagnosis not present

## 2016-11-11 DIAGNOSIS — N951 Menopausal and female climacteric states: Secondary | ICD-10-CM | POA: Diagnosis not present

## 2016-11-11 DIAGNOSIS — R635 Abnormal weight gain: Secondary | ICD-10-CM | POA: Diagnosis not present

## 2016-11-11 DIAGNOSIS — Z6826 Body mass index (BMI) 26.0-26.9, adult: Secondary | ICD-10-CM | POA: Diagnosis not present

## 2016-12-07 NOTE — Progress Notes (Signed)
Cardiology Office Note    Date:  12/08/2016   ID:  MEDORA ROORDA, DOB [birth year], MRN [id number]  PCP:  Horatio Pel, MD  Cardiologist:   Sanda Klein, MD   No chief complaint: visual changes, elevated BP for 1 month   History of Present Illness:  Sherry Fernandez is a 56 y.o. female with Recent onset hypertension, elevated cholesterol and migraine headaches. She returns in follow-up for adjustment of antihypertensive medications.   The 10 mg dose of amlodipine caused excessive reduction in blood pressure. The 5 mg dose seems to provide adequate blood pressure control but is associated with some ankle edema. Her blood pressures consistently around 120/70. She continues to have some visual field disturbances and her ophthalmologist told her that these are permanent since she has some retinal tears.  She initially started amlodipine she had worsening headaches, but subsequently she has her she had a remarkable reduction in the frequency of migraine headaches, better than her previous baseline.  She has been physically active, although not exercising on a regular basis. Every weekend she has been going [place] and just last weekend went on 6 mild mountain hike without any complaints of exertional angina, dyspnea, palpitations, dizziness, syncope or other cardiovascular problems.  Echocardiogram shows borderline LVH (septum 13 mm, posterior wall 11 mm) and grade 1 diastolic dysfunction, but normal left ventricle systolic function and will size left atrium. She did not have renal artery stenosis by Doppler ultrasound.  Her father died at age 52 of cardiomyopathy, while waiting for a transplant. Clinical CHF had started about age 48. Her paternal grandmother died suddenly at age 50, without known previous cardiac illness. Her son has pectus carinatum.  Past Medical History:  Diagnosis Date  . Hypercholesterolemia   . Migraine     Past Surgical History:  Procedure  Laterality Date  . right knee arthrocentesis    . thyroid nodule biopsy      Current Medications: Outpatient Medications Prior to Visit  Medication Sig Dispense Refill  . rizatriptan (MAXALT) 10 MG tablet Take 10 mg by mouth as needed for migraine. May repeat in 2 hours if needed    . rosuvastatin (CRESTOR) 10 MG tablet Take 5 mg by mouth daily.  0  . amLODipine (NORVASC) 5 MG tablet Take 1 tablet (5 mg total) by mouth daily. 90 tablet 3   No facility-administered medications prior to visit.      Allergies:   Sulfamethoxazole   Social History   Social History  . Marital status: Married    Spouse name: N/A  . Number of children: N/A  . Years of education: N/A   Social History Main Topics  . Smoking status: Never Smoker  . Smokeless tobacco: Never Used  . Alcohol use Yes  . Drug use: No  . Sexual activity: Not Asked   Other Topics Concern  . None   Social History Narrative  . None     Family History:  The patient's family history includes Heart failure in her father; Sudden death in her paternal grandmother.   ROS:   Please see the history of present illness.    ROS All other systems reviewed and are negative.   PHYSICAL EXAM:   VS:  BP 120/72   Pulse 68   Ht 5' 4.5" (1.638 m)   Wt 74.6 kg (164 lb 6.4 oz)   BMI 27.78 kg/m    GEN: Well nourished, well developed, in no acute distress  HEENT:  normal  Neck: no JVD, carotid bruits, or masses Cardiac: RRR; no murmurs, rubs, or gallops,no edema  Respiratory:  clear to auscultation bilaterally, normal work of breathing GI: soft, nontender, nondistended, + BS MS: no deformity or atrophy  Skin: warm and dry, no rash Neuro:  Alert and Oriented x 3, Strength and sensation are intact Psych: euthymic mood, full affect  Wt Readings from Last 3 Encounters:  12/08/16 74.6 kg (164 lb 6.4 oz)  09/25/16 71.8 kg (158 lb 6.4 oz)  11/23/15 70.8 kg (156 lb)      Studies/Labs Reviewed:   EKG:  EKG is not ordered today.      Recent Labs: 09/25/2016: BUN 13; Creat 0.78; Potassium 4.1; Sodium 141   Lipid Panel    Component Value Date/Time   CHOL 222 (HH) 06/23/2007 0855   TRIG 119 06/23/2007 0855   HDL 29.1 (L) 06/23/2007 0855   CHOLHDL 7.6 CALC 06/23/2007 0855   VLDL 24 06/23/2007 0855   LDLDIRECT 180.5 06/23/2007 0855    ASSESSMENT:    1. Essential hypertension   2. Hypercholesterolemia   3. Family history of cardiomyopathy      PLAN:  In order of problems listed above:  1. HTN: Excellent response to sodium restriction and amlodipine, I think we can reduce the dose of amlodipine further, down to 2.5 mg daily. This should help with the ankle puffiness.  2. HLP: even in the absence of vascular disease, her LDL is severely elevated and warrants pharmacological Rx. Also has remarkably low HDL despite being minimally overweight and physically active. Recheck with Dr. Shelia Media after a few months back on therapy. Target LDL<100. 3. FHx of heart disease:  It sounds like her dad had nonischemic CMP and her mother had sudden (arrhythmic?) death. The patient has no structural heart disease by echo.   Medication Adjustments/Labs and Tests Ordered: Current medicines are reviewed at length with the patient today.  Concerns regarding medicines are outlined above.  Medication changes, Labs and Tests ordered today are listed in the Patient Instructions below. Patient Instructions  Dr Sallyanne Kuster has recommended making the following medication changes: 1. DECREASE Amlodipine to 2.5 mg daily  Your physician recommends that you schedule a follow-up appointment in 12 months. You will receive a reminder letter in the mail two months in advance. If you don't receive a letter, please call our office to schedule the follow-up appointment.  If you need a refill on your cardiac medications before your next appointment, please call your pharmacy.    Signed, Sanda Klein, MD  12/08/2016 9:56 AM    [facility] [address], [place], [place]  [address] Phone: [phone]; Fax: [phone]

## 2016-12-08 ENCOUNTER — Ambulatory Visit (INDEPENDENT_AMBULATORY_CARE_PROVIDER_SITE_OTHER): Payer: BLUE CROSS/BLUE SHIELD | Admitting: Cardiovascular Disease

## 2016-12-08 ENCOUNTER — Encounter: Payer: Self-pay | Admitting: Cardiovascular Disease

## 2016-12-08 VITALS — BP 120/72 | HR 68 | Ht 64.5 in | Wt 164.4 lb

## 2016-12-08 DIAGNOSIS — Z8249 Family history of ischemic heart disease and other diseases of the circulatory system: Secondary | ICD-10-CM

## 2016-12-08 DIAGNOSIS — E7800 Pure hypercholesterolemia, unspecified: Secondary | ICD-10-CM

## 2016-12-08 DIAGNOSIS — I10 Essential (primary) hypertension: Secondary | ICD-10-CM | POA: Diagnosis not present

## 2016-12-08 MED ORDER — AMLODIPINE BESYLATE 2.5 MG PO TABS: 2.5000 mg | ORAL_TABLET | Freq: Every day | ORAL | 3 refills | Status: DC

## 2016-12-08 NOTE — Patient Instructions (Signed)
Dr Croitoru has recommended making the following medication changes: 1. DECREASE Amlodipine to 2.5 mg daily  Your physician recommends that you schedule a follow-up appointment in 12 months. You will receive a reminder letter in the mail two months in advance. If you don't receive a letter, please call our office to schedule the follow-up appointment.  If you need a refill on your cardiac medications before your next appointment, please call your pharmacy. 

## 2017-03-01 IMAGING — US US CAROTID DUPLEX BILAT
1 series · 14 of 24 positions shown · non-contrast
Comparison: 02/02/2013

CLINICAL DATA: Carotid stenosis

EXAM:
BILATERAL CAROTID DUPLEX ULTRASOUND
TECHNIQUE: Gray scale imaging, color Doppler and duplex ultrasound were
performed of bilateral carotid and vertebral arteries in the neck.

[Series 1: us carotid duplex bilat · 0.07mm/px · 14 of 76 slices shown]
[im 1/76]
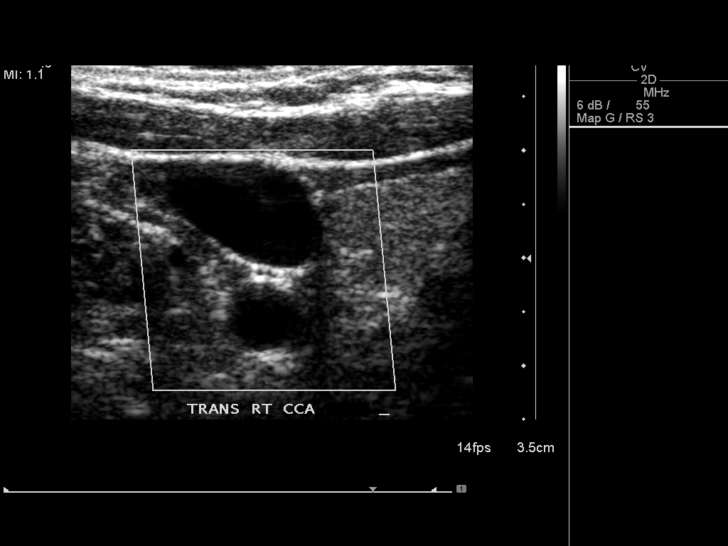
[im 7/76]
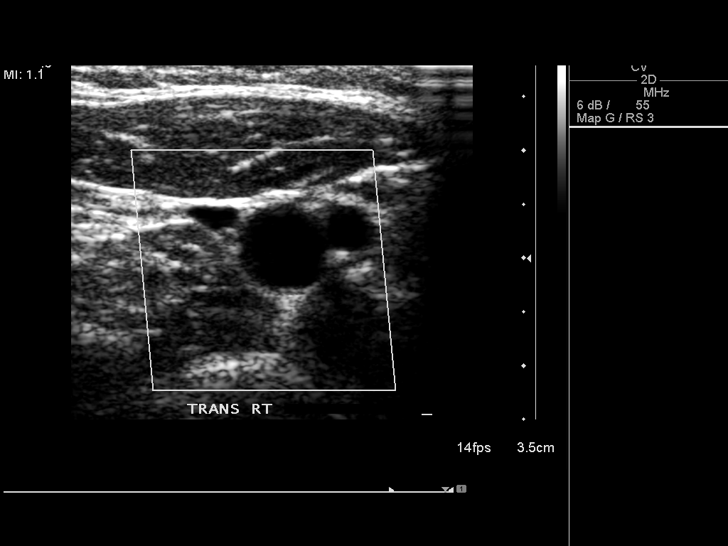
[im 14/76]
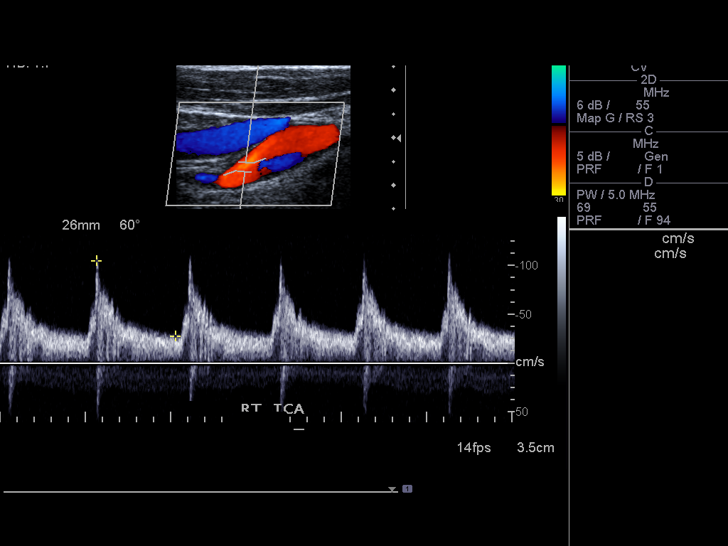
[im 20/76]
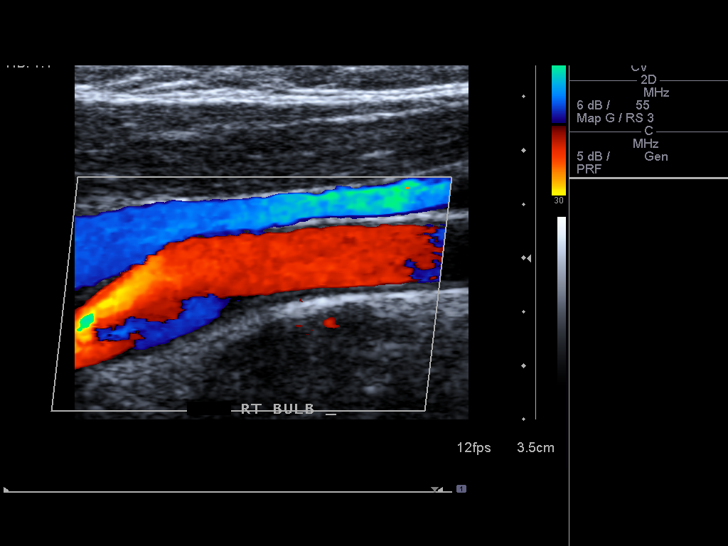
[im 23/76]
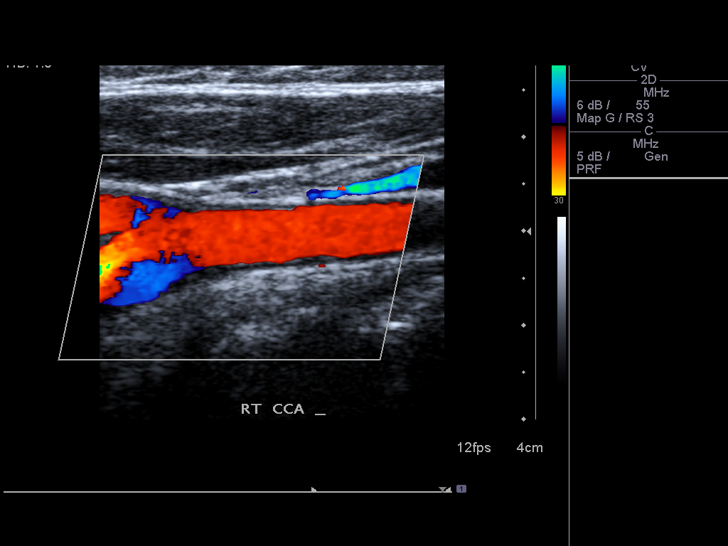
[im 30/76]
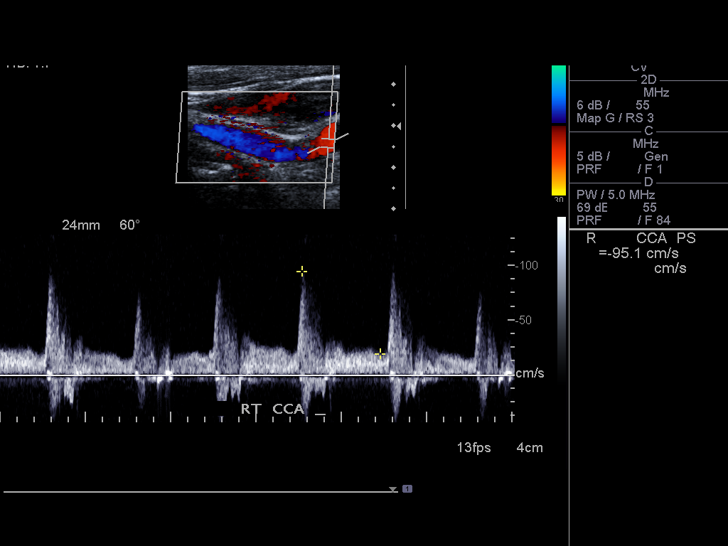
[im 36/76]
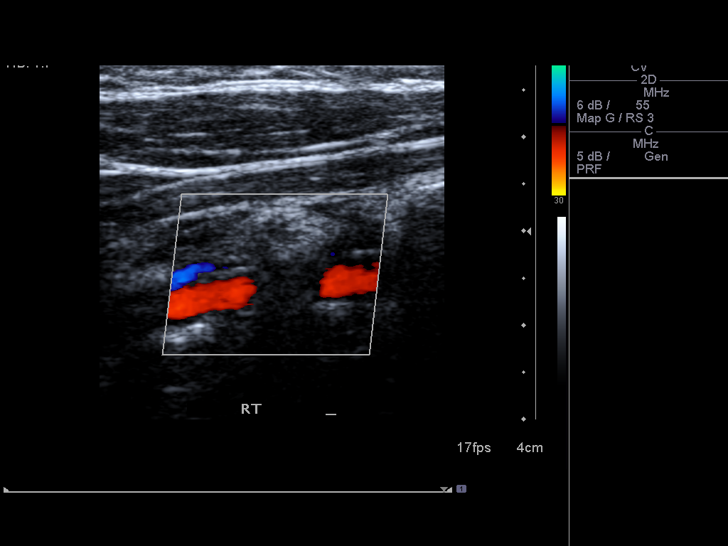
[im 40/76]
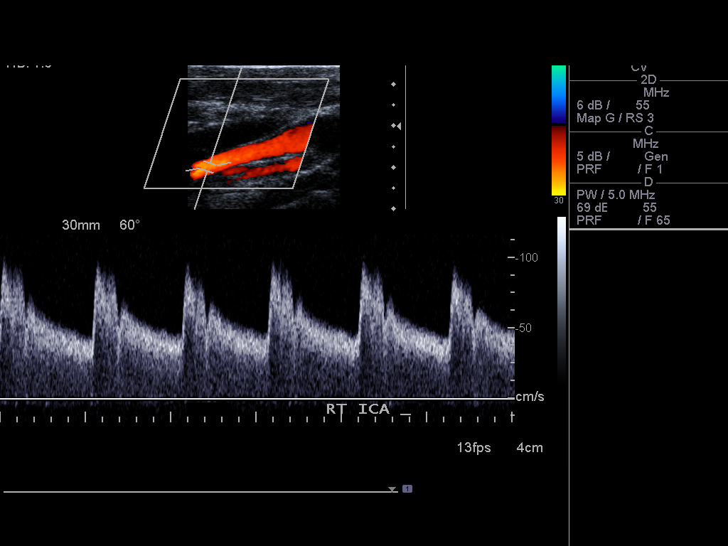
[im 46/76]
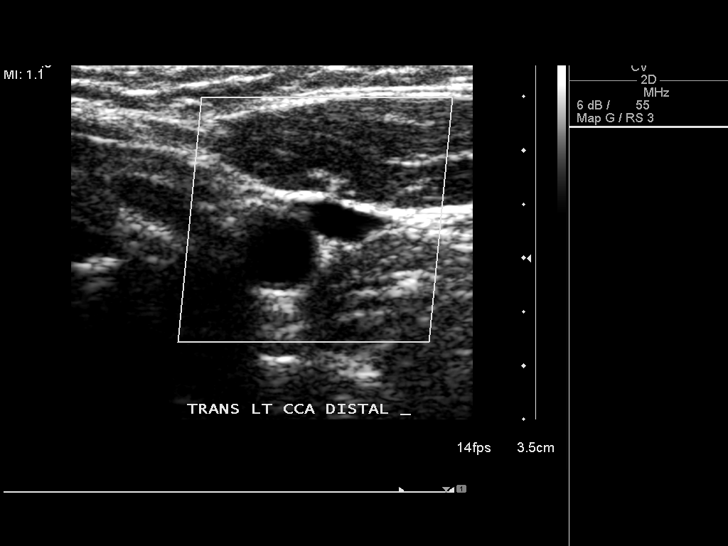
[im 53/76]
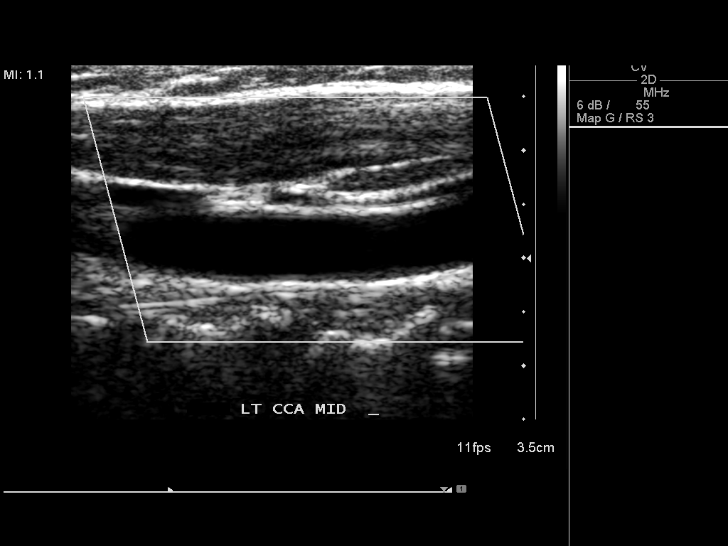
[im 59/76]
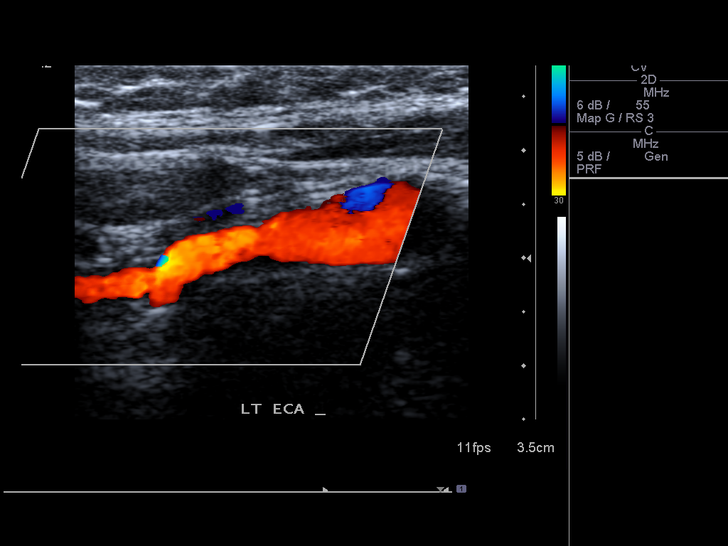
[im 62/76]
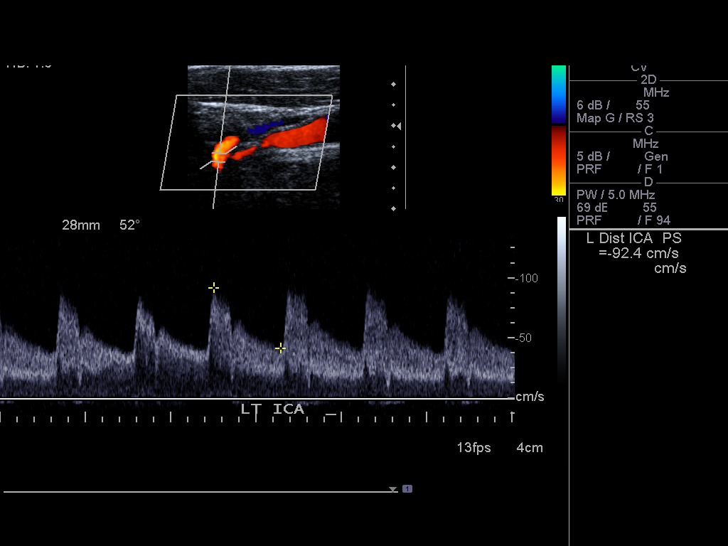
[im 69/76]
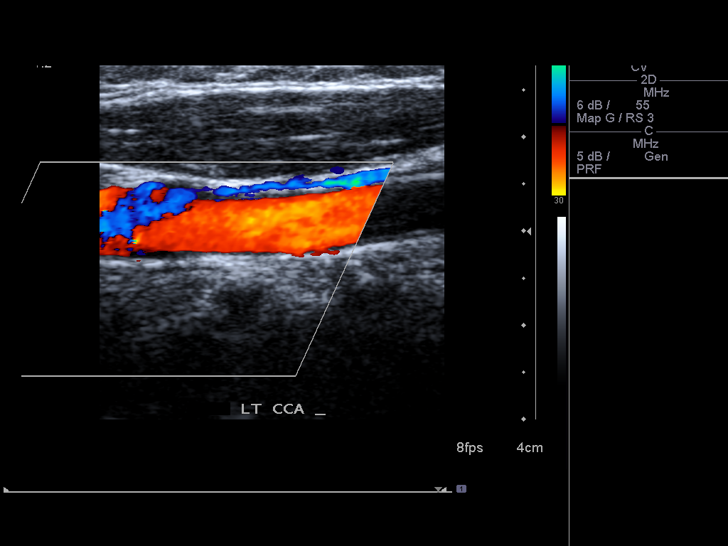
[im 76/76]
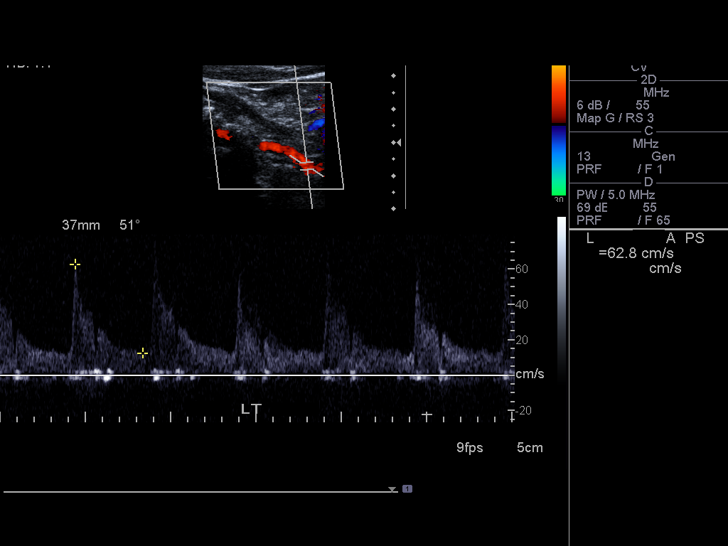

[14 of 24 positions shown; findings below may reference images not displayed]

FINDINGS: Criteria: Quantification of carotid stenosis is based on velocity
parameters that correlate the residual internal carotid diameter
with NASCET-based stenosis levels, using the diameter of the distal
internal carotid lumen as the denominator for stenosis measurement.

The following velocity measurements were obtained:

RIGHT

ICA:  107 cm/sec

CCA:  95 cm/sec

SYSTOLIC ICA/CCA RATIO:

DIASTOLIC ICA/CCA RATIO:

ECA:  115 cm/sec

LEFT

ICA:  92 cm/sec

CCA:  113 cm/sec

SYSTOLIC ICA/CCA RATIO:

DIASTOLIC ICA/CCA RATIO:

ECA:  100 cm/sec

RIGHT CAROTID ARTERY: Little if any plaque in the bulb. Normal low
resistance internal Doppler pattern.

RIGHT VERTEBRAL ARTERY:  Antegrade.

LEFT CAROTID ARTERY: Little if any plaque in the bulb. Low
resistance internal carotid Doppler pattern.

LEFT VERTEBRAL ARTERY:  Antegrade.
IMPRESSION: Less than 50% stenosis in the right and left internal carotid
arteries.

## 2017-03-17 DIAGNOSIS — E559 Vitamin D deficiency, unspecified: Secondary | ICD-10-CM | POA: Diagnosis not present

## 2017-03-17 DIAGNOSIS — Z0000 Encounter for general adult medical examination without abnormal findings: Secondary | ICD-10-CM | POA: Diagnosis not present

## 2017-03-17 DIAGNOSIS — E118 Type 2 diabetes mellitus with unspecified complications: Secondary | ICD-10-CM | POA: Diagnosis not present

## 2017-03-23 DIAGNOSIS — E7800 Pure hypercholesterolemia, unspecified: Secondary | ICD-10-CM | POA: Diagnosis not present

## 2017-03-23 DIAGNOSIS — I6522 Occlusion and stenosis of left carotid artery: Secondary | ICD-10-CM | POA: Diagnosis not present

## 2017-03-23 DIAGNOSIS — Z0000 Encounter for general adult medical examination without abnormal findings: Secondary | ICD-10-CM | POA: Diagnosis not present

## 2017-04-20 ENCOUNTER — Ambulatory Visit (INDEPENDENT_AMBULATORY_CARE_PROVIDER_SITE_OTHER): Payer: BLUE CROSS/BLUE SHIELD | Admitting: Orthopaedic Surgery

## 2017-04-20 ENCOUNTER — Ambulatory Visit (INDEPENDENT_AMBULATORY_CARE_PROVIDER_SITE_OTHER): Payer: BLUE CROSS/BLUE SHIELD

## 2017-04-20 ENCOUNTER — Encounter (INDEPENDENT_AMBULATORY_CARE_PROVIDER_SITE_OTHER): Payer: Self-pay | Admitting: Orthopaedic Surgery

## 2017-04-20 VITALS — Ht 64.5 in | Wt 159.0 lb

## 2017-04-20 DIAGNOSIS — M542 Cervicalgia: Secondary | ICD-10-CM | POA: Diagnosis not present

## 2017-04-20 DIAGNOSIS — M4722 Other spondylosis with radiculopathy, cervical region: Secondary | ICD-10-CM | POA: Diagnosis not present

## 2017-04-20 NOTE — Progress Notes (Signed)
Office Visit Note   Patient: Sherry Fernandez           Date of Birth: Sep 21, 1960           MRN: [id number] Visit Date: 04/20/2017              Requested by: Deland Pretty, MD [address] [place], [place] [address] PCP: Deland Pretty, MD   Assessment & Plan: Visit Diagnoses:  1. Neck pain   2. Other spondylosis with radiculopathy, cervical region     Plan: We'll set her up for some home cervical traction usage appropriate technique discussed. She can use a soft collar intermittently. We'll set up for some physical therapy in addition and see her back in 5 weeks.  Follow-Up Instructions: Return in about 5 weeks (around 05/25/2017).   Orders:  Orders Placed This Encounter  Procedures  . XR Cervical Spine 2 or 3 views  . Ambulatory referral to Physical Therapy   No orders of the defined types were placed in this encounter.     Procedures: No procedures performed   Clinical Data: No additional findings.   Subjective: Chief Complaint  Patient presents with  . Neck - Pain    HPI 56 year old female seen with constant right-sided neck pain 4-5 months. She denies numbness or tingling in the hand. She occasionally has some burning in her upper arm just below the elbow. He has discomfort turning and rotating. She denies fever or chills. No symptoms on the left side.  Review of Systems review of systems updated positive for increased cholesterol history of headaches occasionally. Hypertension. Knee chondromalacia left knee.   Objective: Vital Signs: Ht 5' 4.5" (1.638 m)   Wt 159 lb (72.1 kg)   BMI 26.87 kg/m   Physical Exam  Constitutional: She is oriented to person, place, and time. She appears well-developed.  HENT:  Head: Normocephalic.  Right Ear: External ear normal.  Left Ear: External ear normal.  Eyes: Pupils are equal, round, and reactive to light.  Neck: No tracheal deviation present. No thyromegaly present.  Cardiovascular: Normal rate.     Pulmonary/Chest: Effort normal.  Abdominal: Soft.  Musculoskeletal:  Patient has brachial plexus tenderness in the right side mild to moderately positive Spurling. Upper Cerner reflexes are 2+ and symmetrical. No impingement of the shoulder. Biceps triceps brachioradialis are normal. No lower extremity hyperreflexia normal hip range of motion. Mild crepitus with knee extension. Distal pulses are intact upper and lower extremities.  Neurological: She is alert and oriented to person, place, and time.  Skin: Skin is warm and dry.  Psychiatric: She has a normal mood and affect. Her behavior is normal.    Ortho Exam  Specialty Comments:  No specialty comments available.  Imaging: No results found.   PMFS History: Patient Active Problem List   Diagnosis Date Noted  . Other spondylosis with radiculopathy, cervical region 05/03/2017  . Hypertension 09/25/2016  . Family history of cardiomyopathy 09/25/2016  . SINUSITIS- ACUTE-NOS 10/28/2007  . JOINT EFFUSION, LEFT KNEE 07/05/2007  . MIGRAINE, COMMON 06/30/2007  . Hypercholesterolemia 02/25/2007   Past Medical History:  Diagnosis Date  . Hypercholesterolemia   . Migraine     Family History  Problem Relation Age of Onset  . Heart failure Father   . Sudden death Paternal Grandmother     Past Surgical History:  Procedure Laterality Date  . right knee arthrocentesis    . thyroid nodule biopsy     Social History  Occupational History  . Not on file.   Social History Main Topics  . Smoking status: Never Smoker  . Smokeless tobacco: Never Used  . Alcohol use Yes  . Drug use: No  . Sexual activity: Not on file

## 2017-05-03 DIAGNOSIS — M4722 Other spondylosis with radiculopathy, cervical region: Secondary | ICD-10-CM | POA: Insufficient documentation

## 2017-05-25 ENCOUNTER — Ambulatory Visit (INDEPENDENT_AMBULATORY_CARE_PROVIDER_SITE_OTHER): Payer: BLUE CROSS/BLUE SHIELD | Admitting: Orthopaedic Surgery

## 2017-10-27 DIAGNOSIS — H5213 Myopia, bilateral: Secondary | ICD-10-CM | POA: Diagnosis not present

## 2017-10-27 DIAGNOSIS — R7309 Other abnormal glucose: Secondary | ICD-10-CM | POA: Diagnosis not present

## 2017-10-27 DIAGNOSIS — E039 Hypothyroidism, unspecified: Secondary | ICD-10-CM | POA: Diagnosis not present

## 2017-10-27 DIAGNOSIS — G43009 Migraine without aura, not intractable, without status migrainosus: Secondary | ICD-10-CM | POA: Diagnosis not present

## 2017-10-27 DIAGNOSIS — G43909 Migraine, unspecified, not intractable, without status migrainosus: Secondary | ICD-10-CM | POA: Diagnosis not present

## 2017-10-27 DIAGNOSIS — E7800 Pure hypercholesterolemia, unspecified: Secondary | ICD-10-CM | POA: Diagnosis not present

## 2017-10-27 DIAGNOSIS — H04123 Dry eye syndrome of bilateral lacrimal glands: Secondary | ICD-10-CM | POA: Diagnosis not present

## 2017-11-01 ENCOUNTER — Encounter: Payer: Self-pay | Admitting: Physician Assistant

## 2017-11-01 ENCOUNTER — Ambulatory Visit: Payer: BLUE CROSS/BLUE SHIELD | Admitting: Physician Assistant

## 2017-11-01 VITALS — BP 116/84 | HR 83 | Ht 64.0 in | Wt 165.4 lb

## 2017-11-01 DIAGNOSIS — I10 Essential (primary) hypertension: Secondary | ICD-10-CM | POA: Diagnosis not present

## 2017-11-01 DIAGNOSIS — E7800 Pure hypercholesterolemia, unspecified: Secondary | ICD-10-CM

## 2017-11-01 DIAGNOSIS — Z8249 Family history of ischemic heart disease and other diseases of the circulatory system: Secondary | ICD-10-CM

## 2017-11-01 NOTE — Patient Instructions (Addendum)
Medication Instructions:  Your physician recommends that you continue on your current medications as directed. Please refer to the Current Medication list given to you today.  Labwork: NONE   Testing/Procedures: NONE  Follow-Up: Your physician wants you to follow-up in: [place]. You will receive a reminder letter in the mail two months in advance. If you don't receive a letter, please call our office to schedule the follow-up appointment.  Any Other Special Instructions Will Be Listed Below (If Applicable). TARGET HEART RATE WHEN EXERCISING IS 120 AND 2 15 Minute SESSIONS ARE RINE  If you need a refill on your cardiac medications before your next appointment, please call your pharmacy.

## 2017-11-01 NOTE — Progress Notes (Signed)
Thanks, Suanne Marker.  The ECG is not yet scanned in so I cannot comment on it. Will keep an eye out for it. It is quite reasonable for her to have a plain treadmill ECG stress test, even with her asymptomatic status, considering how bad her family history is shaping up to be recently. No indication for ASA that I can see as of yet. MCr

## 2017-11-01 NOTE — Progress Notes (Signed)
Cardiology Office Note   Date:  11/01/2017   ID:  Sherry Fernandez, DOB Oct 12, 1960, MRN [id number]  PCP:  Deland Pretty, MD  Cardiologist:  Dr Sallyanne Kuster, 12/08/2016  Rosaria Ferries, PA-C   No chief complaint on file.   History of Present Illness: Sherry Fernandez is a 57 y.o. female with a history of HTN, HLD, migraines, mild ankle edema on amlodipine, borderline LVH on echo, FH cardiomyopathy (father died of CHF at 56 while awaiting transplant)  11/2016 office visit, amlodipine decreased to 2.5 mg qd  Sherry Fernandez presents for cardiology follow up.   Her migraines were getting bad, she has started on monthly injections.  She has only had one injection, but she has not had a migraine since she did this.  Her daughter was diagnosed with CAD, cardiomyopathy, got an ICD and got 8 stents placed.  She lives in [place].  Her sister just had an MI and got stents, she is 79. She has intermittent tachycardia, ?SVT.   She never gets chest pain. She walks on the treadmill regularly and hikes in the mountains. Her HR goes to 150 or more, but she does not get chest pain. She admits that she does not exercise as often as she might because it is hard to find the time to do an hour workout. She will get SOB hiking uphill in the mountains.  She will be on the treadmill with a heart rate of 150 and not feel bad but when she is walking uphill, she will notice shortness of breath.  She does not know what her heart rate is when she feels this.  She wonders if it is the altitude is they are in the mountains.  Some ankle edema from the amlodipine, tolerates it well.  She does not have orthopnea or PND.  She has not had any chest pain.   Past Medical History:  Diagnosis Date  . FH: CAD (coronary artery disease)   . HTN (hypertension)   . Hypercholesterolemia   . Migraine     Past Surgical History:  Procedure Laterality Date  . right knee arthrocentesis    . thyroid nodule biopsy      Current  Outpatient Medications  Medication Sig Dispense Refill  . amLODipine (NORVASC) 2.5 MG tablet Take 1 tablet (2.5 mg total) by mouth daily. 90 tablet 3  . Fremanezumab-vfrm (AJOVY) 225 MG/1.5ML SOSY Inject 1.5 mLs into the skin every 30 (thirty) days.    . rizatriptan (MAXALT) 10 MG tablet Take 10 mg by mouth as needed for migraine. May repeat in 2 hours if needed    . rosuvastatin (CRESTOR) 10 MG tablet Take 5 mg by mouth daily.  0   No current facility-administered medications for this visit.     Allergies:   Sulfamethoxazole    Social History:  The patient  reports that  has never smoked. she has never used smokeless tobacco. She reports that she drinks alcohol. She reports that she does not use drugs.   Family History:  The patient's family history includes CAD in her daughter and sister; Cardiomyopathy in her daughter; Heart failure (age of onset: 82) in her father; Sudden death in her paternal grandmother.    ROS:  Please see the history of present illness. All other systems are reviewed and negative.    PHYSICAL EXAM: VS:  BP 116/84   Pulse 83   Ht 5\' 4"  (1.626 m)   Wt 165 lb 6.4 oz (75  kg)   BMI 28.39 kg/m  , BMI Body mass index is 28.39 kg/m. GEN: Well nourished, well developed, female in no acute distress  HEENT: normal for age  Neck: no JVD, no carotid bruit, no masses Cardiac: RRR; soft murmur, no rubs, or gallops Respiratory:  clear to auscultation bilaterally, normal work of breathing GI: soft, nontender, nondistended, + BS MS: no deformity or atrophy; no edema; distal pulses are 2+ in all 4 extremities   Skin: warm and dry, no rash Neuro:  Strength and sensation are intact Psych: euthymic mood, full affect   EKG:  EKG is ordered today. The ekg ordered today demonstrates sinus rhythm, heart rate 83, no is acute ischemic changes.  Lateral T waves are different from her previous ECG, unclear significance.   Recent Labs: No results found for requested labs  within last 8760 hours.    Lipid Panel    Component Value Date/Time   CHOL 222 (HH) 06/23/2007 0855   TRIG 119 06/23/2007 0855   HDL 29.1 (L) 06/23/2007 0855   CHOLHDL 7.6 CALC 06/23/2007 0855   VLDL 24 06/23/2007 0855   LDLDIRECT 180.5 06/23/2007 0855     Wt Readings from Last 3 Encounters:  11/01/17 165 lb 6.4 oz (75 kg)  04/20/17 159 lb (72.1 kg)  12/08/16 164 lb 6.4 oz (74.6 kg)     Other studies Reviewed: Additional studies/ records that were reviewed today include: Office notes and testing.  ASSESSMENT AND PLAN:  1.  Family history CAD: She has a good activity level and no ischemic symptoms.  Basically, she is giving herself a stress test every time she gets her heart rate up to 150, as that is greater than 85% of her maximum predicted heart rate.  She is not having ischemic symptoms with this.  Ongoing cardiac risk factor reduction is encouraged.**Discuss with MD if she should be taking aspirin 81 mg daily.  2.  Hyperlipidemia: This is followed by her PCP, I have requested that she get the labs from her PCP so we can see how treatment for this is going.  3.  Hypertension: Blood pressures under good control on current medications, no changes needed.  4.  ECG: She has some minor lateral T wave changes of unclear significance.  Reviewed this with Dr. Sallyanne Kuster to see if any additional workup is indicated.   Current medicines are reviewed at length with the patient today.  The patient does not have concerns regarding medicines.  The following changes have been made:  no change  Labs/ tests ordered today include:   Orders Placed This Encounter  Procedures  . EKG 12-Lead     Disposition:   FU with Dr Sallyanne Kuster  Signed, Rosaria Ferries, PA-C  11/01/2017 3:31 PM    [place] Phone: [phone]; Fax: [phone]  This note was written with the assistance of speech recognition software. Please excuse any transcriptional errors.

## 2017-11-02 DIAGNOSIS — E7800 Pure hypercholesterolemia, unspecified: Secondary | ICD-10-CM | POA: Diagnosis not present

## 2017-11-02 DIAGNOSIS — E039 Hypothyroidism, unspecified: Secondary | ICD-10-CM | POA: Diagnosis not present

## 2017-11-02 DIAGNOSIS — R7309 Other abnormal glucose: Secondary | ICD-10-CM | POA: Diagnosis not present

## 2017-11-18 DIAGNOSIS — Z6828 Body mass index (BMI) 28.0-28.9, adult: Secondary | ICD-10-CM | POA: Diagnosis not present

## 2017-11-18 DIAGNOSIS — Z1231 Encounter for screening mammogram for malignant neoplasm of breast: Secondary | ICD-10-CM | POA: Diagnosis not present

## 2017-11-18 DIAGNOSIS — R300 Dysuria: Secondary | ICD-10-CM | POA: Diagnosis not present

## 2017-11-18 DIAGNOSIS — Z01419 Encounter for gynecological examination (general) (routine) without abnormal findings: Secondary | ICD-10-CM | POA: Diagnosis not present

## 2017-11-30 ENCOUNTER — Other Ambulatory Visit: Payer: Self-pay | Admitting: Physician Assistant

## 2017-11-30 DIAGNOSIS — I10 Essential (primary) hypertension: Secondary | ICD-10-CM

## 2017-12-06 ENCOUNTER — Telehealth: Payer: Self-pay

## 2017-12-06 NOTE — Telephone Encounter (Signed)
-----   Message from Roland Earl sent at 12/06/2017  8:21 AM EDT ----- Regarding: RE: Needs test scheduled Is the patient aware? ----- Message ----- From: Harold Hedge, CMA Sent: 12/06/2017   7:28 AM To: [facility] Admin Pool Subject: Needs test scheduled                           Please contact patient and schedule test per Dr C.  Thanks,  Darden Dates  ----- Message ----- From: Reola Mosher Sent: 12/02/2017   4:24 PM To: Harold Hedge, CMA  Dr. Sallyanne Kuster wants her to have an exercise treadmill test which I have ordered.  Can you make sure that gets scheduled and call her?  Thanks

## 2017-12-06 NOTE — Telephone Encounter (Signed)
Per Dr Loletha Grayer and DPR I contacted patient and left a detailed message on her voicemail and informed her that Dr C recommended she have a stress test; Suanne Marker ordered test.

## 2017-12-20 ENCOUNTER — Telehealth: Payer: Self-pay

## 2017-12-20 NOTE — Telephone Encounter (Signed)
Opened in error

## 2017-12-20 NOTE — Telephone Encounter (Deleted)
-----   Message from Lonn Georgia, PA-C sent at 11/30/2017  9:22 AM EDT ----- See below. Dr C recommends an exercise treadmill test.  Can you call her and arrange this? It is ordered.  Thanks  ----- Message ----- From: Sanda Klein, MD Sent: 11/01/2017   5:46 PM To: Lonn Georgia, PA-C    ----- Message ----- From: Reola Mosher Sent: 11/01/2017   4:09 PM To: Sanda Klein, MD

## 2017-12-27 ENCOUNTER — Telehealth (HOSPITAL_COMMUNITY): Payer: Self-pay | Admitting: Physician Assistant

## 2017-12-27 NOTE — Telephone Encounter (Signed)
11/30/17 Called pt and spoke with her and to sch ETT and she stated that she was driving and would call me back once she got to her office..RG 12/01/17 Called pt and spoke with her again and she stated that she was driving and would call back to schedule this appt..RG 12/07/17 spoke with patient--she wants to wait and spek with Dr. Loletha Grayer before scheduling (he is  her neighbor)kf 12/27/17 patient stated that she feels great and says that there is no need for the test..RG

## 2018-02-14 ENCOUNTER — Other Ambulatory Visit: Payer: Self-pay | Admitting: Cardiovascular Disease

## 2018-02-14 NOTE — Telephone Encounter (Signed)
Rx(s) sent to pharmacy electronically.  

## 2018-03-23 DIAGNOSIS — Z0000 Encounter for general adult medical examination without abnormal findings: Secondary | ICD-10-CM | POA: Diagnosis not present

## 2018-03-29 DIAGNOSIS — Z0000 Encounter for general adult medical examination without abnormal findings: Secondary | ICD-10-CM | POA: Diagnosis not present

## 2018-03-30 DIAGNOSIS — R7309 Other abnormal glucose: Secondary | ICD-10-CM | POA: Diagnosis not present

## 2018-05-24 DIAGNOSIS — J4521 Mild intermittent asthma with (acute) exacerbation: Secondary | ICD-10-CM | POA: Diagnosis not present

## 2018-07-05 DIAGNOSIS — M545 Low back pain: Secondary | ICD-10-CM | POA: Diagnosis not present

## 2018-07-05 DIAGNOSIS — R102 Pelvic and perineal pain: Secondary | ICD-10-CM | POA: Diagnosis not present

## 2018-09-07 DIAGNOSIS — R05 Cough: Secondary | ICD-10-CM | POA: Diagnosis not present

## 2018-09-07 DIAGNOSIS — J029 Acute pharyngitis, unspecified: Secondary | ICD-10-CM | POA: Diagnosis not present

## 2018-09-07 DIAGNOSIS — R6889 Other general symptoms and signs: Secondary | ICD-10-CM | POA: Diagnosis not present

## 2018-12-28 ENCOUNTER — Other Ambulatory Visit: Payer: Self-pay | Admitting: Cardiovascular Disease

## 2018-12-28 DIAGNOSIS — M20011 Mallet finger of right finger(s): Secondary | ICD-10-CM | POA: Diagnosis not present

## 2018-12-28 DIAGNOSIS — M79641 Pain in right hand: Secondary | ICD-10-CM | POA: Diagnosis not present

## 2018-12-28 MED ORDER — AMLODIPINE BESYLATE 2.5 MG PO TABS: 2.5000 mg | ORAL_TABLET | Freq: Every day | ORAL | 3 refills | Status: DC

## 2018-12-28 NOTE — Progress Notes (Signed)
Face to face encounter with patient today Sherry Klein, MD, [facility] [phone] office [phone] pager

## 2019-04-17 ENCOUNTER — Telehealth: Payer: Self-pay | Admitting: *Deleted

## 2019-04-17 NOTE — Telephone Encounter (Signed)
The patient has been set up to see Dr. Fletcher Anon on 8/18 for chest pain. Dr. Sallyanne Kuster is out of the office this week.

## 2019-04-18 ENCOUNTER — Encounter: Payer: Self-pay | Admitting: Cardiovascular Disease

## 2019-04-18 ENCOUNTER — Other Ambulatory Visit: Payer: Self-pay

## 2019-04-18 ENCOUNTER — Ambulatory Visit (INDEPENDENT_AMBULATORY_CARE_PROVIDER_SITE_OTHER): Payer: 59 | Admitting: Cardiovascular Disease

## 2019-04-18 VITALS — BP 128/80 | HR 79 | Temp 98.1°F | Ht 64.0 in | Wt 165.0 lb

## 2019-04-18 DIAGNOSIS — I10 Essential (primary) hypertension: Secondary | ICD-10-CM | POA: Diagnosis not present

## 2019-04-18 DIAGNOSIS — E785 Hyperlipidemia, unspecified: Secondary | ICD-10-CM

## 2019-04-18 DIAGNOSIS — R072 Precordial pain: Secondary | ICD-10-CM | POA: Diagnosis not present

## 2019-04-18 MED ORDER — METOPROLOL TARTRATE 100 MG PO TABS: ORAL_TABLET | ORAL | 0 refills | Status: DC

## 2019-04-18 NOTE — Progress Notes (Signed)
Cardiology Office Note   Date:  04/21/2019   ID:  Sherry Fernandez, DOB 1961/07/15, MRN [id number]  PCP:  Deland Pretty, MD  Cardiologist:  Dr. Doristine Bosworth chief complaint on file.     History of Present Illness: Sherry Fernandez is a 58 y.o. female who was added to my schedule for evaluation of chest pain.  She has known history of essential hypertension, hyperlipidemia, migraine and family history of cardiomyopathy. Her daughter was diagnosed with coronary artery disease and cardiomyopathy.  She had an ICD placement as well as 8 stents.  Her sister had a recent MI and got stents as well.  She is very active and exercises regularly on a treadmill as well as hiking.   Previous echocardiogram in 2018 showed normal LV systolic function with no significant valvular abnormalities.  There was mild left ventricular hypertrophy.  She had an episode of chest pain yesterday that was described as severe heartburn.  However, it was associated with dizziness.  It lasted for about 10 minutes.  She took aspirin with some improvement in symptoms.  EMS were called and EKG was normal.  She reports prior similar episodes in the past but not as severe.  The pain happened at rest.  She has not had any recent exertional chest pain but does report mild exertional dyspnea.  She thinks it might have been related to GERD and known hiatal hernia. She has history of mild hypertension previously on amlodipine but no longer on medication.  Past Medical History:  Diagnosis Date  . FH: CAD (coronary artery disease)   . HTN (hypertension)   . Hypercholesterolemia   . Migraine     Past Surgical History:  Procedure Laterality Date  . right knee arthrocentesis    . thyroid nodule biopsy       Current Outpatient Medications  Medication Sig Dispense Refill  . Fremanezumab-vfrm (AJOVY) 225 MG/1.5ML SOSY Inject 1.5 mLs into the skin every 30 (thirty) days.    . rizatriptan (MAXALT) 10 MG tablet Take 10 mg by mouth as  needed for migraine. May repeat in 2 hours if needed    . rosuvastatin (CRESTOR) 10 MG tablet Take 5 mg by mouth daily.  0  . metoprolol tartrate (LOPRESSOR) 100 MG tablet Take within two hours of the test 1 tablet 0   No current facility-administered medications for this visit.     Allergies:   Sulfamethoxazole    Social History:  The patient  reports that she has never smoked. She has never used smokeless tobacco. She reports current alcohol use. She reports that she does not use drugs.   Family History:  The patient's family history includes CAD in her daughter and sister; Cardiomyopathy in her daughter; Heart failure (age of onset: 74) in her father; Sudden death in her paternal grandmother.    ROS:  Please see the history of present illness.   Otherwise, review of systems are positive for none.   All other systems are reviewed and negative.    PHYSICAL EXAM: VS:  BP 128/80 (BP Location: Left Arm, Patient Position: Sitting, Cuff Size: Normal)   Pulse 79   Temp 98.1 F (36.7 C)   Ht 5\' 4"  (1.626 m)   Wt 165 lb (74.8 kg)   BMI 28.32 kg/m  , BMI Body mass index is 28.32 kg/m. GEN: Well nourished, well developed, in no acute distress  HEENT: normal  Neck: no JVD, carotid bruits, or masses Cardiac: RRR; no  murmurs, rubs, or gallops,no edema  Respiratory:  clear to auscultation bilaterally, normal work of breathing GI: soft, nontender, nondistended, + BS MS: no deformity or atrophy  Skin: warm and dry, no rash Neuro:  Strength and sensation are intact Psych: euthymic mood, full affect   EKG:  EKG is not ordered today. EKG from yesterday was reviewed which showed normal sinus rhythm with no significant ST or T wave changes.   Recent Labs: No results found for requested labs within last 8760 hours.    Lipid Panel    Component Value Date/Time   CHOL 222 (HH) 06/23/2007 0855   TRIG 119 06/23/2007 0855   HDL 29.1 (L) 06/23/2007 0855   CHOLHDL 7.6 CALC 06/23/2007 0855    VLDL 24 06/23/2007 0855   LDLDIRECT 180.5 06/23/2007 0855      Wt Readings from Last 3 Encounters:  04/18/19 165 lb (74.8 kg)  11/01/17 165 lb 6.4 oz (75 kg)  04/20/17 159 lb (72.1 kg)       No flowsheet data found.    ASSESSMENT AND PLAN:  1.  Chest pain at rest: Could be GI in nature.  However, she has risk factors for coronary artery disease and unstable angina cannot be completely excluded.  Fortunately, she is not having chest pain at the present time and EKG did not show any ischemic changes.  I discussed different management options with her and I think the best option is cardiac CT with FFR to evaluate for presence of obstructive coronary artery disease.  It will also give [place] information about the presence of atherosclerosis which should help with risk factor modifications.  2.  History of essential hypertension: Blood pressure is controlled without medications.  3.  Hyperlipidemia: I reviewed her labs from 2019 which showed an LDL of 112.  She is currently on rosuvastatin 5 mg daily.  If cardiac testing reveals coronary artery disease, we will have to be more aggressive and get her LDL to below 70.    Disposition:   FU with Dr. Loletha Grayer after cardiac testing.  Signed,  Kathlyn Sacramento, MD  04/21/2019 11:48 AM    [place]

## 2019-04-18 NOTE — Patient Instructions (Addendum)
Medication Instructions:  Your physician recommends that you continue on your current medications as directed. Please refer to the Current Medication list given to you today.  If you need a refill on your cardiac medications before your next appointment, please call your pharmacy.   Lab work: Your provider would like for you to return one week prior to the scheduled Cardiac CT to have the following labs drawn: BMET. You do not need an appointment for the lab. Once in our office lobby there is a podium where you can sign in and ring the doorbell to alert [place] that you are here. The lab is open from 8:00 am to 4:30 pm; closed for lunch from 12:45pm-1:45pm.  If you have labs (blood work) drawn today and your tests are completely normal, you will receive your results only by: [place] (if you have MyChart) OR A paper copy in the mail If you have any lab test that is abnormal or we need to change your treatment, we will call you to review the results.  Testing/Procedures: Your physician has requested that you have cardiac CT. Cardiac computed tomography (CT) is a painless test that uses an x-ray machine to take clear, detailed pictures of your heart. For further information please visit [facility]. Please follow instruction sheet as given.  Follow-Up: Follow up with Dr. Sallyanne Kuster pending the results.  Any Other Special Instructions Will Be Listed Below (If Applicable). Your cardiac CT will be scheduled at one of the below locations:   [facility] [address] [place], [place] [address] (336) [place] [address] [place], [place] [address] [phone]  Please arrive at the [facility] main entrance of [facility] 30-45 minutes prior to test start time. Proceed to the [facility] Radiology Department (first floor) to check-in and test prep.  Please follow these instructions carefully  (unless otherwise directed):  On the Night Before the Test: . Be sure to Drink plenty of water. . Do not consume any caffeinated/decaffeinated beverages or chocolate 12 hours prior to your test. . Do not take any antihistamines 12 hours prior to your test.  On the Day of the Test: . Drink plenty of water. Do not drink any water within one hour of the test. . Do not eat any food 4 hours prior to the test. . You may take your regular medications prior to the test.  . Take metoprolol (Lopressor) two hours prior to test.. . FEMALES- please wear underwire-free bra if availabl       After the Test: . Drink plenty of water. . After receiving IV contrast, you may experience a mild flushed feeling. This is normal. . On occasion, you may experience a mild rash up to 24 hours after the test. This is not dangerous. If this occurs, you can take Benadryl 25 mg and increase your fluid intake. . If you experience trouble breathing, this can be serious. If it is severe call 911 IMMEDIATELY. If it is mild, please call our office. . If you take any of these medications: Glipizide/Metformin, Avandament, Glucavance, please do not take 48 hours after completing test.    Please contact the cardiac imaging nurse navigator should you have any questions/concerns Marchia Bond, RN Navigator Cardiac [place] [facility] [phone] Office  [phone] Cell

## 2019-04-26 LAB — BASIC METABOLIC PANEL
BUN/Creatinine Ratio: 16 (ref 9–23)
BUN: 16 mg/dL (ref 6–24)
CO2: 22 mmol/L (ref 20–29)
Calcium: 10 mg/dL (ref 8.7–10.2)
Chloride: 101 mmol/L (ref 96–106)
Creatinine, Ser: 0.99 mg/dL (ref 0.57–1.00)
GFR calc Af Amer: 72 mL/min/{1.73_m2} (ref >59)
GFR calc non Af Amer: 63 mL/min/{1.73_m2} (ref >59)
Glucose: 110 mg/dL — ABNORMAL HIGH (ref 65–99)
Potassium: 4.3 mmol/L (ref 3.5–5.2)
Sodium: 141 mmol/L (ref 134–144)

## 2019-05-01 ENCOUNTER — Telehealth (HOSPITAL_COMMUNITY): Payer: Self-pay | Admitting: Emergency Medicine

## 2019-05-01 NOTE — Telephone Encounter (Signed)
Reaching out to patient to offer assistance regarding upcoming cardiac imaging study; pt verbalizes understanding of appt date/time, parking situation and where to check in, pre-test NPO status and medications ordered, and verified current allergies; name and call back number provided for further questions should they arise Esperanza Madrazo RN Navigator Cardiac Imaging [facility] [phone] office [phone] cell 

## 2019-05-02 ENCOUNTER — Ambulatory Visit (HOSPITAL_COMMUNITY)
Admission: RE | Admit: 2019-05-02 | Discharge: 2019-05-02 | Disposition: A | Discharge status: Home / Self Care | Payer: 59 | Source: Ambulatory Visit | Attending: Cardiovascular Disease | Admitting: Cardiovascular Disease

## 2019-05-02 ENCOUNTER — Encounter (HOSPITAL_COMMUNITY): Payer: Self-pay

## 2019-05-02 ENCOUNTER — Other Ambulatory Visit: Payer: Self-pay

## 2019-05-02 DIAGNOSIS — R072 Precordial pain: Secondary | ICD-10-CM | POA: Insufficient documentation

## 2019-05-02 MED ORDER — IOHEXOL 350 MG/ML SOLN
100.0000 mL | Freq: Once | INTRAVENOUS | Status: AC | PRN
  Administered 2019-05-02: 100 mL via INTRAVENOUS

## 2019-05-02 MED ORDER — NITROGLYCERIN 0.4 MG SL SUBL
SUBLINGUAL_TABLET | SUBLINGUAL | Status: AC
  Filled 2019-05-02: qty 2

## 2019-05-02 MED ORDER — NITROGLYCERIN 0.4 MG SL SUBL
0.8000 mg | SUBLINGUAL_TABLET | Freq: Once | SUBLINGUAL | Status: AC
  Administered 2019-05-02: 09:00:00 0.8 mg via SUBLINGUAL
  Filled 2019-05-02: qty 25

## 2019-05-02 NOTE — Discharge Instructions (Signed)
Testing With IV Contrast Material °IV contrast material is a fluid that is used with some imaging tests. It is injected into your body through a vein. Contrast material is used when your health care providers need a detailed look at organs, tissues, or blood vessels that may not show up with the standard test. The material may be used when an X-ray, an MRI, a CT scan, or an ultrasound is done. °IV contrast material may be used for imaging tests that check: °· Muscles, skin, and fat. °· Breasts. °· Brain. °· Digestive tract. °· Heart. °· Organs such as the liver, kidneys, lungs, bladder, and many others. °· Arteries and veins. °Tell a health care provider about: °· Any allergies you have, especially an allergy to contrast material. °· All medicines you are taking, including metformin, beta blockers, NSAIDs (such as ibuprofen), interleukin-2, vitamins, herbs, eye drops, creams, and over-the-counter medicines. °· Any problems you or family members have had with the use of contrast material. °· Any blood disorders you have, such as sickle cell anemia. °· Any surgeries you have had. °· Any medical conditions you have or have had, especially alcohol abuse, dehydration, asthma, or kidney, liver, or heart problems. °· Whether you are pregnant or may be pregnant. °· Whether you are breastfeeding. Most contrast materials are safe for use in breastfeeding women. °What are the risks? °Generally, this is a safe procedure. However, problems may occur, including: °· Headache. °· Itching, skin rash, and hives. °· Nausea and vomiting. °· Allergic reactions. °· Wheezing or difficulty breathing. °· Abnormal heart rate. °· Changes in blood pressure. °· Throat swelling. °· Kidney damage. °What happens before the procedure? °Medicines °Ask your health care provider about: °· Changing or stopping your regular medicines. This is especially important if you are taking diabetes medicines or blood thinners. °· Taking medicines such as aspirin  and ibuprofen. These medicines can thin your blood. Do not take these medicines unless your health care provider tells you to take them. °· Taking over-the-counter medicines, vitamins, herbs, and supplements. °If you are at risk of having a reaction to the IV contrast material, you may be asked to take medicine before the procedure to prevent a reaction. °General instructions °· Follow instructions from your health care provider about eating or drinking restrictions. °· You may have an exam or lab tests to make sure that you can safely get IV contrast material. °· Ask if you will be given a medicine to help you relax (sedative) during the procedure. If so, plan to have someone take you home from the hospital or clinic. °What happens during the procedure? °· You may be given a sedative to help you relax. °· An IV will be inserted into one of your veins. °· Contrast material will be injected into your IV. °· You may feel warmth or flushing as the contrast material enters your bloodstream. °· You may have a metallic taste in your mouth for a few minutes. °· The needle may cause some discomfort and bruising. °· After the contrast material is in your body, the imaging test will be done. °The procedure may vary among health care providers and hospitals. °What can I expect after the procedure? °· The IV will be removed. °· You may be taken to a recovery area if sedation medicines were used. Your blood pressure, heart rate, breathing rate, and blood oxygen level will be monitored until you leave the hospital or clinic. °Follow these instructions at home: ° °· Take over-the-counter and   prescription medicines only as told by your health care provider. °? Your health care provider may tell you to not take certain medicines for a couple of days after the procedure. This is especially important if you are taking diabetes medicines. °· If you are told, drink enough fluid to keep your urine pale yellow. This will help to remove  the contrast material out of your body. °· Do not drive for 24 hours if you were given a sedative during your procedure. °· It is up to you to get the results of your procedure. Ask your health care provider, or the department that is doing the procedure, when your results will be ready. °· Keep all follow-up visits as told by your health care provider. This is important. °Contact a health care provider if: °· You have redness, swelling, or pain near your IV site. °Get help right away if: °· You have an abnormal heart rhythm. °· You have trouble breathing. °· You have: °? Chest pain. °? Pain in your back, neck, arm, jaw, or stomach. °? Nausea or sweating. °? Hives or a rash. °· You start shaking and cannot stop. °These symptoms may represent a serious problem that is an emergency. Do not wait to see if the symptoms will go away. Get medical help right away. Call your local emergency services (911 in the [place] Do not drive yourself to the hospital. °Summary °· IV contrast material may be used for imaging tests to help your health care providers see your organs and tissues more clearly. °· Tell your health care provider if you are pregnant or may be pregnant. °· During the procedure, you may feel warmth or flushing as the contrast material enters your bloodstream. °· After the procedure, drink enough fluid to keep your urine pale yellow. °This information is not intended to replace advice given to you by your health care provider. Make sure you discuss any questions you have with your health care provider. °Document Released: 08/05/2009 Document Revised: 11/03/2018 Document Reviewed: 11/03/2018 °Elsevier Patient Education © 2020 Else[employer] Inc[employer] ° ° °Cardiac CT Angiogram ° °A cardiac CT angiogram is a procedure to look at the heart and the area around the heart. It may be done to help find the cause of chest pains or other symptoms of heart disease. During this procedure, a large X-ray machine, called a CT scanner,  takes detailed pictures of the heart and the surrounding area after a dye (contrast material) has been injected into blood vessels in the area. The procedure is also sometimes called a coronary CT angiogram, coronary artery scanning, or CTA. °A cardiac CT angiogram allows the health care provider to see how well blood is flowing to and from the heart. The health care provider will be able to see if there are any problems, such as: °· Blockage or narrowing of the coronary arteries in the heart. °· Fluid around the heart. °· Signs of weakness or disease in the muscles, valves, and tissues of the heart. °Tell a health care provider about: °· Any allergies you have. This is especially important if you have had a previous allergic reaction to contrast dye. °· All medicines you are taking, including vitamins, herbs, eye drops, creams, and over-the-counter medicines. °· Any blood disorders you have. °· Any surgeries you have had. °· Any medical conditions you have. °· Whether you are pregnant or may be pregnant. °· Any anxiety disorders, chronic pain, or other conditions you have that may increase your stress or prevent   you from lying still. °What are the risks? °Generally, this is a safe procedure. However, problems may occur, including: °· Bleeding. °· Infection. °· Allergic reactions to medicines or dyes. °· Damage to other structures or organs. °· Kidney damage from the dye or contrast that is used. °· Increased risk of cancer from radiation exposure. This risk is low. Talk with your health care provider about: °? The risks and benefits of testing. °? How you can receive the lowest dose of radiation. °What happens before the procedure? °· Wear comfortable clothing and remove any jewelry, glasses, dentures, and hearing aids. °· Follow instructions from your health care provider about eating and drinking. This may include: °? For 12 hours before the test -- avoid caffeine. This includes tea, coffee, soda, energy drinks,  and diet pills. Drink plenty of water or other fluids that do not have caffeine in them. Being well-hydrated can prevent complications. °? For 4-6 hours before the test -- stop eating and drinking. The contrast dye can cause nausea, but this is less likely if your stomach is empty. °· Ask your health care provider about changing or stopping your regular medicines. This is especially important if you are taking diabetes medicines, blood thinners, or medicines to treat erectile dysfunction. °What happens during the procedure? °· Hair on your chest may need to be removed so that small sticky patches called electrodes can be placed on your chest. These will transmit information that helps to monitor your heart during the test. °· An IV tube will be inserted into one of your veins. °· You might be given a medicine to control your heart rate during the test. This will help to ensure that good images are obtained. °· You will be asked to lie on an exam table. This table will slide in and out of the CT machine during the procedure. °· Contrast dye will be injected into the IV tube. You might feel warm, or you may get a metallic taste in your mouth. °· You will be given a medicine (nitroglycerin) to relax (dilate) the arteries in your heart. °· The table that you are lying on will move into the CT machine tunnel for the scan. °· The person running the machine will give you instructions while the scans are being done. You may be asked to: °? Keep your arms above your head. °? Hold your breath. °? Stay very still, even if the table is moving. °· When the scanning is complete, you will be moved out of the machine. °· The IV tube will be removed. °The procedure may vary among health care providers and hospitals. °What happens after the procedure? °· You might feel warm, or you may get a metallic taste in your mouth from the contrast dye. °· You may have a headache from the nitroglycerin. °· After the procedure, drink water or  other fluids to wash (flush) the contrast material out of your body. °· Contact a health care provider if you have any symptoms of allergy to the contrast. These symptoms include: °? Shortness of breath. °? Rash or hives. °? A racing heartbeat. °· Most people can return to their normal activities right after the procedure. Ask your health care provider what activities are safe for you. °· It is up to you to get the results of your procedure. Ask your health care provider, or the department that is doing the procedure, when your results will be ready. °Summary °· A cardiac CT angiogram is a procedure to   look at the heart and the area around the heart. It may be done to help find the cause of chest pains or other symptoms of heart disease. °· During this procedure, a large X-ray machine, called a CT scanner, takes detailed pictures of the heart and the surrounding area after a dye (contrast material) has been injected into blood vessels in the area. °· Ask your health care provider about changing or stopping your regular medicines before the procedure. This is especially important if you are taking diabetes medicines, blood thinners, or medicines to treat erectile dysfunction. °· After the procedure, drink water or other fluids to wash (flush) the contrast material out of your body. °This information is not intended to replace advice given to you by your health care provider. Make sure you discuss any questions you have with your health care provider. °Document Released: 07/30/2008 Document Revised: 07/30/2017 Document Reviewed: 07/06/2016 °Elsevier Patient Education © 2020 Elsevier Inc. ° °

## 2019-05-02 NOTE — Progress Notes (Signed)
Pt tolerated exam without incident.  Pt C/O slight headache after nitro admin.  Caffeinated beverage and crackers provided to patient after exam.  PIV removed.  Discharge instructions discussed.  Pt discharged

## 2019-09-04 ENCOUNTER — Encounter: Payer: Self-pay | Admitting: Cardiovascular Disease

## 2019-09-05 ENCOUNTER — Other Ambulatory Visit: Payer: Self-pay | Admitting: *Deleted

## 2019-09-05 ENCOUNTER — Telehealth: Payer: Self-pay | Admitting: Cardiovascular Disease

## 2019-09-05 ENCOUNTER — Other Ambulatory Visit: Payer: Self-pay | Admitting: Urology

## 2019-09-05 DIAGNOSIS — I10 Essential (primary) hypertension: Secondary | ICD-10-CM

## 2019-09-05 DIAGNOSIS — R918 Other nonspecific abnormal finding of lung field: Secondary | ICD-10-CM

## 2019-09-05 NOTE — Telephone Encounter (Signed)
   [facility] Pre-operative Risk Assessment    Request for surgical clearance:  1. What type of surgery is being performed? Left radical nephrectomy 2. When is this surgery scheduled? 10/13/19  3.What type of clearance is required (medical clearance vs. Pharmacy clearance to hold med vs. Both)? medical   4. Are there any medications that need to be held prior to surgery and how long? no  5. Practice name and name of physician performing surgery? [facility] 6. What is your office phone number? [phone]   7.   What is your office fax number? [phone]  8.   Anesthesia type (None, local, MAC, general) ? General  Sherry Fernandez 09/05/2019, 4:10 PM  _________________________________________________________________   (provider comments below)

## 2019-09-05 NOTE — Telephone Encounter (Signed)
Spoke with patient regarding Chest CT scheduled for Tuesday 09/12/19 at 3:00pm at [facility]---arrival time is 2:45pm 1st floor admissions office---pt to come in 09/06/19 for lab work.

## 2019-09-06 ENCOUNTER — Other Ambulatory Visit: Payer: Self-pay

## 2019-09-06 DIAGNOSIS — R918 Other nonspecific abnormal finding of lung field: Secondary | ICD-10-CM

## 2019-09-06 DIAGNOSIS — I10 Essential (primary) hypertension: Secondary | ICD-10-CM

## 2019-09-06 LAB — BASIC METABOLIC PANEL
BUN/Creatinine Ratio: 14 (ref 12–28)
BUN: 13 mg/dL (ref 8–27)
CO2: 23 mmol/L (ref 20–29)
Calcium: 9.7 mg/dL (ref 8.7–10.3)
Chloride: 103 mmol/L (ref 96–106)
Creatinine, Ser: 0.94 mg/dL (ref 0.57–1.00)
GFR calc Af Amer: 76 mL/min/{1.73_m2} (ref >59)
GFR calc non Af Amer: 66 mL/min/{1.73_m2} (ref >59)
Glucose: 100 mg/dL — ABNORMAL HIGH (ref 65–99)
Potassium: 4.5 mmol/L (ref 3.5–5.2)
Sodium: 143 mmol/L (ref 134–144)

## 2019-09-06 NOTE — Telephone Encounter (Signed)
   Patient is scheduled for a coronary CTA 09/12/2019 for an ischemic evaluation given atypical chest pain and strong family history of CAD. Will await these results before providing clearance.   Callback: Please notify the requesting surgeons office that we will follow-up after 09/12/2019 to comment on her preoperative risk.   Abigail Butts, PA-C 09/06/19; 8:59 AM

## 2019-09-06 NOTE — Telephone Encounter (Signed)
Per Pre OP Provider today I called and left message for surgery scheduler for Dr. Tresa Moore pt has upcoming Coronary CTA 09/12/19. Once the CT has been read our office will send over clearance notes whether the pt has been cleared or not cleared for their surgery. If any questions please call [phone].

## 2019-09-12 ENCOUNTER — Ambulatory Visit (HOSPITAL_COMMUNITY)
Admission: RE | Admit: 2019-09-12 | Discharge: 2019-09-12 | Disposition: A | Discharge status: Home / Self Care | Payer: 59 | Source: Ambulatory Visit | Attending: Cardiovascular Disease | Admitting: Cardiovascular Disease

## 2019-09-12 ENCOUNTER — Other Ambulatory Visit: Payer: Self-pay

## 2019-09-12 DIAGNOSIS — R918 Other nonspecific abnormal finding of lung field: Secondary | ICD-10-CM | POA: Insufficient documentation

## 2019-09-12 MED ORDER — IOHEXOL 300 MG/ML  SOLN
75.0000 mL | Freq: Once | INTRAMUSCULAR | Status: AC | PRN
  Administered 2019-09-12: 75 mL via INTRAVENOUS

## 2019-09-13 ENCOUNTER — Other Ambulatory Visit: Payer: Self-pay | Admitting: *Deleted

## 2019-09-13 DIAGNOSIS — C649 Malignant neoplasm of unspecified kidney, except renal pelvis: Secondary | ICD-10-CM

## 2019-09-13 NOTE — Telephone Encounter (Signed)
Left a voice message for the surgery scheduler to give me a call back to confirm if the clearance for surgery was received.

## 2019-09-13 NOTE — Telephone Encounter (Signed)
   Primary Cardiologist: Sanda Klein, MD  Chart reviewed as part of pre-operative protocol coverage. 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 Patient was seen by Dr. Fletcher Anon in 05/2019 at which time she reported some chest pain. Felt to possibly be GI in nature but coronary CT was ordered given risk factors. Coronary CT was done in 05/2019 and  showed mild atherosclerosis of the proximal RCA with non-calcified plaque and coronary calcium score of 0. Called and spoke with patient today. She continues to have chest discomfort but she states she thinks it is related to her gallbladder. She has been told that she is also going to need this removed. No significant shortness of breath, palpitations, syncope, or acute CHF symptoms. Able to complete > 4 METs. Given past medical history and time since last visit, based on ACC/AHA guidelines, Sherry Fernandez would be at acceptable risk for the planned procedure without further cardiovascular testing.   I will route this recommendation to the requesting party via Epic fax function and remove from pre-op pool.  Please call with questions.  Darreld Mclean, PA-C 09/13/2019, 9:25 AM

## 2019-09-13 NOTE — Telephone Encounter (Signed)
Serlita from [facility] returned my called to say that their office received the clearance and that the patient procedure was moved up to 10/04/19 due to an opening in the schedule.

## 2019-09-18 ENCOUNTER — Telehealth: Payer: Self-pay | Admitting: Oncology

## 2019-09-18 NOTE — Telephone Encounter (Signed)
A new patient appt has been scheduled for Ms. Hitsman to see Dr. Benay Spice on 1/22 at [place] been made aware to arrive 15 minutes early.

## 2019-09-22 ENCOUNTER — Other Ambulatory Visit: Payer: Self-pay

## 2019-09-22 ENCOUNTER — Ambulatory Visit
Admission: RE | Admit: 2019-09-22 | Discharge: 2019-09-22 | Disposition: A | Discharge status: Home / Self Care | Payer: Self-pay | Source: Ambulatory Visit | Attending: Oncology | Admitting: Oncology

## 2019-09-22 ENCOUNTER — Inpatient Hospital Stay: Payer: 59 | Attending: Oncology | Admitting: Oncology

## 2019-09-22 ENCOUNTER — Encounter: Payer: Self-pay | Admitting: *Deleted

## 2019-09-22 VITALS — BP 118/90 | HR 74 | Temp 98.0°F | Resp 18 | Ht 64.0 in | Wt 158.4 lb

## 2019-09-22 DIAGNOSIS — C649 Malignant neoplasm of unspecified kidney, except renal pelvis: Secondary | ICD-10-CM | POA: Diagnosis not present

## 2019-09-22 NOTE — Progress Notes (Unsigned)
Faxed ROI to [facility] department at [phone] att: Dr. Koleen Distance.

## 2019-09-22 NOTE — Progress Notes (Signed)
[place] New Patient Consult   Requesting MD: Deland Pretty, [address] [place],  [place] [address]   Sherry Fernandez 59 y.o.  [birth year]    Reason for Consult: Renal mass, liver lesion   HPI: Sherry Fernandez developed severe "chest "pain in September 2020.  She underwent a cardiac CT on 05/02/2019.  This revealed mild atherosclerosis of the proximal RCA.  She continued to have intermittent episodes of chest and subxiphoid discomfort and has been evaluated at [facility] (we do not have these records available today).  She reports undergoing a negative upper and lower endoscopic evaluation.  She reports an abdominal ultrasound revealed a renal mass.   A CT of the abdomen and pelvis confirmed a left renal mass (we do not have the CT report available today).  She was referred to Dr. Tammi Klippel and is scheduled for a left nephrectomy.  A staging CT of the chest on 09/12/2019 revealed a stable 4 mm subpleural right middle lobe nodule, no other suspicious pulmonary nodules.  A 4.9 x 3.9 cm heterogenous Lee enhancing mass is noted in the upper pole of the left kidney.  Indeterminate lesion is seen at the junction of the right and left liver measuring 1 cm.  Past Medical History:  Diagnosis Date  . FH: CAD (coronary artery disease)   . HTN (hypertension)   . Hypercholesterolemia   . Migraine     .  History of ankylosing spondylitis treated with Humira for 3 years, no symptoms of present   .  History of recurrent urinary tract infections   .  Ankylosing spondylitis   .  G3P3  Past Surgical History:  Procedure Laterality Date  . right knee arthrocentesis    . thyroid nodule biopsy      .  Bladder suspension surgery  Medications: Reviewed  Allergies:  Allergies  Allergen Reactions  . Sulfamethoxazole Swelling    Family history: Her maternal grandmother had lung cancer and was a smoker.  No other family history of cancer.  Social History:    She lives in [place].  She works in an office occupation.  She does not use cigarettes.  She reports social alcohol use.  No transfusion history.  No risk factor for HIV or hepatitis.  ROS:   Positives include: Intermittent gross hematuria, "hot "feeling most of the time with occasional sweats, intermittent chest and subxiphoid discomfort, right back pain for the past 6 months, nausea for years after eating rich foods A complete ROS was otherwise negative.  Physical Exam:  Blood pressure 118/90, pulse 74, temperature 98 F (36.7 C), temperature source Temporal, resp. rate 18, height 5\' 4"  (1.626 m), weight 158 lb 6.4 oz (71.8 kg), SpO2 99 %.  HEENT: Neck without mass Lungs: Clear bilaterally Cardiac: Regular rate and rhythm Abdomen: No hepatosplenomegaly, no mass, nontender  Vascular: No leg edema Lymph nodes: No cervical, supraclavicular, axillary, or inguinal nodes Neurologic: Alert and oriented, motor exam appears intact in the upper and lower extremities bilaterally Skin: No rash Musculoskeletal: No spine tenderness    Imaging:  CT images from 09/12/2019 reviewed with Sherry Fernandez and her husband   Assessment/Plan:   1. Renal cell carcinoma  Left renal mass, benign-appearing right middle lobe subpleural nodule noted on a chest CT 09/12/2019 2. Indeterminate liver lesion noted on chest CT 09/12/2019 3. History of ankylosing spondylitis   Disposition:   Sherry Fernandez has a clinical diagnosis of renal cell carcinoma involving a left  renal mass.  A left nephrectomy has been recommended by Dr. Tresa Moore for management of the locally advanced mass.  There is no clinical evidence of metastatic disease.  I suspect the indeterminate liver lesion noted on the chest CT 09/12/2019 is a benign finding.  We are obtaining images from the [facility] abdomen CT for comparison.  We will recommend an MRI of the liver as indicated.  The etiology of her intermittent chest/upper abdominal pain is  unclear.  She reports the pain is felt to be related to gallbladder disease and she plans to have a cholecystectomy after the nephrectomy procedure.  I will reviewed the CT images and report back to her with the indication for a liver MRI.  Metastatic disease to the liver is unlikely.  She will return for an office visit after the nephrectomy procedure.  Betsy Coder, MD  09/22/2019, 5:30 PM

## 2019-09-25 ENCOUNTER — Telehealth: Payer: Self-pay | Admitting: Oncology

## 2019-09-25 ENCOUNTER — Inpatient Hospital Stay
Admission: RE | Admit: 2019-09-25 | Discharge: 2019-09-25 | Disposition: A | Discharge status: Home / Self Care | Payer: Self-pay | Source: Ambulatory Visit | Attending: Oncology | Admitting: Oncology

## 2019-09-25 ENCOUNTER — Other Ambulatory Visit (HOSPITAL_COMMUNITY): Payer: Self-pay | Admitting: Oncology

## 2019-09-25 DIAGNOSIS — C801 Malignant (primary) neoplasm, unspecified: Secondary | ICD-10-CM

## 2019-09-25 NOTE — Telephone Encounter (Signed)
Scheduled per los. Called and spoke with patient. Confirmed appt 

## 2019-09-26 ENCOUNTER — Telehealth: Payer: Self-pay | Admitting: Cardiovascular Disease

## 2019-09-26 NOTE — Patient Instructions (Signed)
DUE TO COVID-19 ONLY ONE VISITOR IS ALLOWED TO COME WITH YOU AND STAY IN THE WAITING ROOM ONLY DURING PRE OP AND PROCEDURE DAY OF SURGERY. THE 1 VISITOR MAY VISIT WITH YOU AFTER SURGERY IN YOUR PRIVATE ROOM DURING VISITING HOURS ONLY!  YOU NEED TO HAVE A COVID 19 TEST ON;09/30/19 @     , THIS TEST MUST BE DONE BEFORE SURGERY, COME  [place], [place] , [address].  ([place]) ONCE YOUR COVID TEST IS COMPLETED, PLEASE BEGIN THE QUARANTINE INSTRUCTIONS AS OUTLINED IN YOUR HANDOUT.                Tarshia G Wilmeth     Your procedure is scheduled on: 10/04/2019   Report to [facility] Main  Entrance    Report to admitting at: 6:30 AM     Call this number if you have problems the morning of surgery [phone]    Remember:   Do not eat food or drink liquids :After Midnight.   BRUSH YOUR TEETH MORNING OF SURGERY AND RINSE YOUR MOUTH OUT, NO CHEWING GUM CANDY OR MINTS.     Take these medicines the morning of surgery with A SIP OF WATER: FAMOTIDINE,DEXILANT,RIZATRIPTAN                         You may not have any metal on your body including hair pins and              piercings  Do not wear jewelry, make-up, lotions, powders or perfumes, deodorant             Do not wear nail polish on your fingernails.  Do not shave  48 hours prior to surgery.                 Do not bring valuables to the hospital. [place].  Contacts, dentures or bridgework may not be worn into surgery.  Leave suitcase in the car. After surgery it may be brought to your room.     Patients discharged the day of surgery will not be allowed to drive home. IF YOU ARE HAVING SURGERY AND GOING HOME THE SAME DAY, YOU MUST HAVE AN ADULT TO DRIVE YOU HOME AND BE WITH YOU FOR 24 HOURS. YOU MAY GO HOME BY TAXI OR UBER OR ORTHERWISE, BUT AN ADULT MUST ACCOMPANY YOU HOME AND STAY WITH YOU FOR 24 HOURS.  Name and phone number of your  driver:  Special Instructions: N/A              Please read over the following fact sheets you were given: _____________________________________________________________________                  [facility] - Preparing for Surgery Before surgery, you can play an important role.  Because skin is not sterile, your skin needs to be as free of germs as possible.  You can reduce the number of germs on your skin by washing with CHG (chlorahexidine gluconate) soap before surgery.  CHG is an antiseptic cleaner which kills germs and bonds with the skin to continue killing germs even after washing. Please DO NOT use if you have an allergy to CHG or antibacterial soaps.  If your skin becomes reddened/irritated stop using the CHG and inform your nurse when you arrive at Short  Stay. Do not shave (including legs and underarms) for at least 48 hours prior to the first CHG shower.  You may shave your face/neck. Please follow these instructions carefully:  1.  Shower with CHG Soap the night before surgery and the  morning of Surgery.  2.  If you choose to wash your hair, wash your hair first as usual with your  normal  shampoo.  3.  After you shampoo, rinse your hair and body thoroughly to remove the  shampoo.                           4.  Use CHG as you would any other liquid soap.  You can apply chg directly  to the skin and wash                       Gently with a scrungie or clean washcloth.  5.  Apply the CHG Soap to your body ONLY FROM THE NECK DOWN.   Do not use on face/ open                           Wound or open sores. Avoid contact with eyes, ears mouth and genitals (private parts).                       Wash face,  Genitals (private parts) with your normal soap.             6.  Wash thoroughly, paying special attention to the area where your surgery  will be performed.  7.  Thoroughly rinse your body with warm water from the neck down.  8.  DO NOT shower/wash with your normal soap after using  and rinsing off  the CHG Soap.                9.  Pat yourself dry with a clean towel.            10.  Wear clean pajamas.            11.  Place clean sheets on your bed the night of your first shower and do not  sleep with pets. Day of Surgery : Do not apply any lotions/deodorants the morning of surgery.  Please wear clean clothes to the hospital/surgery center.  FAILURE TO FOLLOW THESE INSTRUCTIONS MAY RESULT IN THE CANCELLATION OF YOUR SURGERY PATIENT SIGNATURE_________________________________  NURSE SIGNATURE__________________________________  ________________________________________________________________________

## 2019-09-26 NOTE — Telephone Encounter (Signed)
Call returned to [place]. She has been advised that the EKG was not scanned. Reached out to Medical Records. They do not have the EKG.

## 2019-09-26 NOTE — Telephone Encounter (Signed)
Sherry Fernandez from [place] at [employer] stating she cannot see the EKG of the patient and that she has an appointment with her tomorrow. She states if it is not necessary she does not want to do another EKG.

## 2019-09-26 NOTE — Progress Notes (Signed)
PCP - Deland Pretty. LOV: 09/22/19. EPIC Cardiologist - Mihai Croiton. Clearance: 09/03/19. EPIC  Chest x-ray -  EKG -  Stress Test -  ECHO -  Cardiac Cath -  CT Chest: 09/12/19 Sleep Study -  CPAP -   Fasting Blood Sugar -  Checks Blood Sugar _____ times a day  Blood Thinner Instructions: Aspirin Instructions: Last Dose:  Anesthesia review:   Patient denies shortness of breath, fever, cough and chest pain at PAT appointment   Patient verbalized understanding of instructions that were given to them at the PAT appointment. Patient was also instructed that they will need to review over the PAT instructions again at home before surgery.

## 2019-09-27 ENCOUNTER — Other Ambulatory Visit: Payer: Self-pay | Admitting: Urology

## 2019-09-27 ENCOUNTER — Encounter (HOSPITAL_COMMUNITY)
Admission: RE | Admit: 2019-09-27 | Discharge: 2019-09-27 | Disposition: A | Discharge status: Home / Self Care | Payer: 59 | Source: Ambulatory Visit | Attending: Urology | Admitting: Urology

## 2019-09-27 ENCOUNTER — Encounter (HOSPITAL_COMMUNITY): Payer: Self-pay

## 2019-09-27 ENCOUNTER — Other Ambulatory Visit: Payer: Self-pay | Admitting: Oncology

## 2019-09-27 ENCOUNTER — Other Ambulatory Visit: Payer: Self-pay

## 2019-09-27 DIAGNOSIS — Z01818 Encounter for other preprocedural examination: Secondary | ICD-10-CM | POA: Insufficient documentation

## 2019-09-27 DIAGNOSIS — C649 Malignant neoplasm of unspecified kidney, except renal pelvis: Secondary | ICD-10-CM

## 2019-09-27 HISTORY — DX: Malignant (primary) neoplasm, unspecified: C80.1

## 2019-09-27 HISTORY — DX: Unspecified osteoarthritis, unspecified site: M19.90

## 2019-09-27 LAB — CBC
HCT: 45.1 % (ref 36.0–46.0)
Hemoglobin: 14.7 g/dL (ref 12.0–15.0)
MCH: 28.5 pg (ref 26.0–34.0)
MCHC: 32.6 g/dL (ref 30.0–36.0)
MCV: 87.4 fL (ref 80.0–100.0)
Platelets: 216 10*3/uL (ref 150–400)
RBC: 5.16 MIL/uL — ABNORMAL HIGH (ref 3.87–5.11)
RDW: 12.5 % (ref 11.5–15.5)
WBC: 6.3 10*3/uL (ref 4.0–10.5)
nRBC: 0 % (ref 0.0–0.2)

## 2019-09-27 LAB — BASIC METABOLIC PANEL
Anion gap: 12 (ref 5–15)
BUN: 19 mg/dL (ref 6–20)
CO2: 27 mmol/L (ref 22–32)
Calcium: 10.3 mg/dL (ref 8.9–10.3)
Chloride: 102 mmol/L (ref 98–111)
Creatinine, Ser: 0.89 mg/dL (ref 0.44–1.00)
GFR calc Af Amer: >60 mL/min (ref >60)
GFR calc non Af Amer: >60 mL/min (ref >60)
Glucose, Bld: 113 mg/dL — ABNORMAL HIGH (ref 70–99)
Potassium: 4 mmol/L (ref 3.5–5.1)
Sodium: 141 mmol/L (ref 135–145)

## 2019-09-27 NOTE — Progress Notes (Signed)
I presented her case at the GI tumor conference this morning.  CT images were reviewed.  Isolated liver lesion is likely a benign finding, but a metastasis cannot be ruled out.  I discussed options with Sherry Fernandez.  She would like to proceed with an MRI of the liver.  The MRI was ordered today.

## 2019-09-27 NOTE — Patient Instructions (Signed)
DUE TO COVID-19 ONLY ONE VISITOR IS ALLOWED TO COME WITH YOU AND STAY IN THE WAITING ROOM ONLY DURING PRE OP AND PROCEDURE DAY OF SURGERY. THE 1 VISITOR MAY VISIT WITH YOU AFTER SURGERY IN YOUR PRIVATE ROOM DURING VISITING HOURS ONLY!  YOU NEED TO HAVE A COVID 19 TEST ON: 09/30/19 @ 10:00 AM, THIS TEST MUST BE DONE BEFORE SURGERY, COME  [address], [place] , [address].  ([place]) ONCE YOUR COVID TEST IS COMPLETED, PLEASE BEGIN THE QUARANTINE INSTRUCTIONS AS OUTLINED IN YOUR HANDOUT.                Sherry Fernandez     Your procedure is scheduled on: 10/04/19   Report to [facility] Main  Entrance   Report to admitting:  At 6:30 AM     Call this number if you have problems the morning of surgery [phone]    Remember:   Do not eat food or drink liquids :After Midnight.   BRUSH YOUR TEETH MORNING OF SURGERY AND RINSE YOUR MOUTH OUT, NO CHEWING GUM CANDY OR MINTS.     Take these medicines the morning of surgery with A SIP OF WATER: famotidine,dexilant,rizatriptan                                 You may not have any metal on your body including hair pins and              piercings  Do not wear jewelry, make-up, lotions, powders or perfumes, deodorant             Do not wear nail polish on your fingernails.  Do not shave  48 hours prior to surgery.                Do not bring valuables to the hospital. [place].  Contacts, dentures or bridgework may not be worn into surgery.  Leave suitcase in the car. After surgery it may be brought to your room.     Patients discharged the day of surgery will not be allowed to drive home. IF YOU ARE HAVING SURGERY AND GOING HOME THE SAME DAY, YOU MUST HAVE AN ADULT TO DRIVE YOU HOME AND BE WITH YOU FOR 24 HOURS. YOU MAY GO HOME BY TAXI OR UBER OR ORTHERWISE, BUT AN ADULT MUST ACCOMPANY YOU HOME AND STAY WITH YOU FOR 24 HOURS.  Name and phone number of your  driver:  Special Instructions: N/A              Please read over the following fact sheets you were given: _____________________________________________________________________             [facility] - Preparing for Surgery Before surgery, you can play an important role.  Because skin is not sterile, your skin needs to be as free of germs as possible.  You can reduce the number of germs on your skin by washing with CHG (chlorahexidine gluconate) soap before surgery.  CHG is an antiseptic cleaner which kills germs and bonds with the skin to continue killing germs even after washing. Please DO NOT use if you have an allergy to CHG or antibacterial soaps.  If your skin becomes reddened/irritated stop using the CHG and inform your nurse when you arrive at Short  Stay. Do not shave (including legs and underarms) for at least 48 hours prior to the first CHG shower.  You may shave your face/neck. Please follow these instructions carefully:  1.  Shower with CHG Soap the night before surgery and the  morning of Surgery.  2.  If you choose to wash your hair, wash your hair first as usual with your  normal  shampoo.  3.  After you shampoo, rinse your hair and body thoroughly to remove the  shampoo.                           4.  Use CHG as you would any other liquid soap.  You can apply chg directly  to the skin and wash                       Gently with a scrungie or clean washcloth.  5.  Apply the CHG Soap to your body ONLY FROM THE NECK DOWN.   Do not use on face/ open                           Wound or open sores. Avoid contact with eyes, ears mouth and genitals (private parts).                       Wash face,  Genitals (private parts) with your normal soap.             6.  Wash thoroughly, paying special attention to the area where your surgery  will be performed.  7.  Thoroughly rinse your body with warm water from the neck down.  8.  DO NOT shower/wash with your normal soap after using and rinsing  off  the CHG Soap.                9.  Pat yourself dry with a clean towel.            10.  Wear clean pajamas.            11.  Place clean sheets on your bed the night of your first shower and do not  sleep with pets. Day of Surgery : Do not apply any lotions/deodorants the morning of surgery.  Please wear clean clothes to the hospital/surgery center.  FAILURE TO FOLLOW THESE INSTRUCTIONS MAY RESULT IN THE CANCELLATION OF YOUR SURGERY PATIENT SIGNATURE_________________________________  NURSE SIGNATURE__________________________________  ________________________________________________________________________

## 2019-09-30 ENCOUNTER — Other Ambulatory Visit (HOSPITAL_COMMUNITY)
Admission: RE | Admit: 2019-09-30 | Discharge: 2019-09-30 | Disposition: A | Discharge status: Home / Self Care | Payer: 59 | Source: Ambulatory Visit | Attending: Urology | Admitting: Urology

## 2019-09-30 DIAGNOSIS — Z20822 Contact with and (suspected) exposure to covid-19: Secondary | ICD-10-CM | POA: Diagnosis present

## 2019-09-30 LAB — SARS CORONAVIRUS 2 (TAT 6-24 HRS): SARS Coronavirus 2: NEGATIVE

## 2019-10-03 NOTE — Anesthesia Preprocedure Evaluation (Addendum)
Anesthesia Evaluation  Patient identified by MRN, date of birth, ID band Patient awake    Reviewed: Allergy & Precautions, H&P , NPO status , Patient's Chart, lab work & pertinent test results  Airway Mallampati: I  TM Distance: >3 FB Neck ROM: Full    Dental no notable dental hx. (+) Teeth Intact, Implants, Dental Advisory Given   Pulmonary neg pulmonary ROS,    Pulmonary exam normal breath sounds clear to auscultation       Cardiovascular Exercise Tolerance: Good hypertension, Pt. on medications negative cardio ROS Normal cardiovascular exam Rhythm:Regular Rate:Normal     Neuro/Psych  Headaches, negative neurological ROS  negative psych ROS   GI/Hepatic negative GI ROS, Neg liver ROS,   Endo/Other  negative endocrine ROSHypothyroidism   Renal/GU negative Renal ROS  negative genitourinary   Musculoskeletal negative musculoskeletal ROS (+) Arthritis ,   Abdominal   Peds negative pediatric ROS (+)  Hematology negative hematology ROS (+)   Anesthesia Other Findings   Reproductive/Obstetrics negative OB ROS                            Anesthesia Physical Anesthesia Plan  ASA: II  Anesthesia Plan: General   Post-op Pain Management: GA combined w/ Regional for post-op pain   Induction: Intravenous  PONV Risk Score and Plan: 3 and Ondansetron, Dexamethasone, Treatment may vary due to age or medical condition and Midazolam  Airway Management Planned: Oral ETT  Additional Equipment:   Intra-op Plan:   Post-operative Plan: Extubation in OR  Informed Consent: I have reviewed the patients History and Physical, chart, labs and discussed the procedure including the risks, benefits and alternatives for the proposed anesthesia with the patient or authorized representative who has indicated his/her understanding and acceptance.       Plan Discussed with: Anesthesiologist  Anesthesia  Plan Comments: (  )        Anesthesia Quick Evaluation

## 2019-10-04 ENCOUNTER — Encounter (HOSPITAL_COMMUNITY): Payer: Self-pay | Admitting: Urology

## 2019-10-04 ENCOUNTER — Inpatient Hospital Stay (HOSPITAL_COMMUNITY)
Admission: RE | Admit: 2019-10-04 | Discharge: 2019-10-06 | DRG: 658 | Disposition: A | Discharge status: Home / Self Care | Payer: 59 | Attending: Urology | Admitting: Urology

## 2019-10-04 ENCOUNTER — Inpatient Hospital Stay (HOSPITAL_COMMUNITY): Payer: 59 | Admitting: Physician Assistant

## 2019-10-04 ENCOUNTER — Encounter (HOSPITAL_COMMUNITY)
Admission: RE | Disposition: A | Discharge status: Home / Self Care | Payer: Self-pay | Source: Home / Self Care | Attending: Urology

## 2019-10-04 ENCOUNTER — Inpatient Hospital Stay (HOSPITAL_COMMUNITY): Payer: 59 | Admitting: Anesthesiology

## 2019-10-04 ENCOUNTER — Other Ambulatory Visit: Payer: Self-pay

## 2019-10-04 DIAGNOSIS — Z882 Allergy status to sulfonamides status: Secondary | ICD-10-CM | POA: Diagnosis not present

## 2019-10-04 DIAGNOSIS — Z79899 Other long term (current) drug therapy: Secondary | ICD-10-CM | POA: Diagnosis not present

## 2019-10-04 DIAGNOSIS — C642 Malignant neoplasm of left kidney, except renal pelvis: Principal | ICD-10-CM | POA: Diagnosis present

## 2019-10-04 DIAGNOSIS — Z20822 Contact with and (suspected) exposure to covid-19: Secondary | ICD-10-CM | POA: Diagnosis present

## 2019-10-04 DIAGNOSIS — Z8249 Family history of ischemic heart disease and other diseases of the circulatory system: Secondary | ICD-10-CM | POA: Diagnosis not present

## 2019-10-04 DIAGNOSIS — N2889 Other specified disorders of kidney and ureter: Secondary | ICD-10-CM | POA: Diagnosis present

## 2019-10-04 DIAGNOSIS — R7303 Prediabetes: Secondary | ICD-10-CM | POA: Diagnosis present

## 2019-10-04 DIAGNOSIS — E039 Hypothyroidism, unspecified: Secondary | ICD-10-CM | POA: Diagnosis present

## 2019-10-04 DIAGNOSIS — M62838 Other muscle spasm: Secondary | ICD-10-CM | POA: Diagnosis not present

## 2019-10-04 HISTORY — PX: ROBOT ASSISTED LAPAROSCOPIC NEPHRECTOMY: SHX5140

## 2019-10-04 LAB — HEMOGLOBIN AND HEMATOCRIT, BLOOD
HCT: 41.3 % (ref 36.0–46.0)
Hemoglobin: 13.5 g/dL (ref 12.0–15.0)

## 2019-10-04 LAB — ABO/RH: ABO/RH(D): O POS

## 2019-10-04 SURGERY — NEPHRECTOMY, RADICAL, ROBOT-ASSISTED, LAPAROSCOPIC, ADULT
Anesthesia: General | Laterality: Left

## 2019-10-04 MED ORDER — ACETAMINOPHEN 500 MG PO TABS
1000.0000 mg | ORAL_TABLET | Freq: Four times a day (QID) | ORAL | Status: AC
  Administered 2019-10-04 – 2019-10-05 (×4): 1000 mg via ORAL
  Filled 2019-10-04 (×4): qty 2

## 2019-10-04 MED ORDER — ONDANSETRON HCL 4 MG/2ML IJ SOLN
INTRAMUSCULAR | Status: AC
  Filled 2019-10-04: qty 2

## 2019-10-04 MED ORDER — DIPHENHYDRAMINE HCL 12.5 MG/5ML PO ELIX: 12.5000 mg | ORAL_SOLUTION | Freq: Four times a day (QID) | ORAL | Status: DC | PRN

## 2019-10-04 MED ORDER — PROPOFOL 10 MG/ML IV BOLUS
INTRAVENOUS | Status: DC | PRN
  Administered 2019-10-04: 160 mg via INTRAVENOUS

## 2019-10-04 MED ORDER — DEXAMETHASONE SODIUM PHOSPHATE 10 MG/ML IJ SOLN
INTRAMUSCULAR | Status: DC | PRN
  Administered 2019-10-04: 10 mg via INTRAVENOUS

## 2019-10-04 MED ORDER — MIDAZOLAM HCL 5 MG/5ML IJ SOLN
INTRAMUSCULAR | Status: DC | PRN
  Administered 2019-10-04: 2 mg via INTRAVENOUS

## 2019-10-04 MED ORDER — ONDANSETRON HCL 4 MG/2ML IJ SOLN
INTRAMUSCULAR | Status: DC | PRN
  Administered 2019-10-04: 4 mg via INTRAVENOUS

## 2019-10-04 MED ORDER — LIDOCAINE 20MG/ML (2%) 15 ML SYRINGE OPTIME
INTRAMUSCULAR | Status: DC | PRN
  Administered 2019-10-04: 1.5 mg/kg/h via INTRAVENOUS

## 2019-10-04 MED ORDER — OXYCODONE HCL 5 MG/5ML PO SOLN: 5.0000 mg | Freq: Once | ORAL | Status: DC | PRN

## 2019-10-04 MED ORDER — KETAMINE HCL 10 MG/ML IJ SOLN
INTRAMUSCULAR | Status: DC | PRN
  Administered 2019-10-04: 10 mg via INTRAVENOUS
  Administered 2019-10-04: 40 mg via INTRAVENOUS

## 2019-10-04 MED ORDER — CHLORHEXIDINE GLUCONATE CLOTH 2 % EX PADS: 6.0000 | MEDICATED_PAD | Freq: Every day | CUTANEOUS | Status: DC

## 2019-10-04 MED ORDER — MAGNESIUM CITRATE PO SOLN: 1.0000 | Freq: Once | ORAL | Status: DC

## 2019-10-04 MED ORDER — LACTATED RINGERS IR SOLN
Status: DC | PRN
  Administered 2019-10-04: 1000 mL

## 2019-10-04 MED ORDER — MEPERIDINE HCL 50 MG/ML IJ SOLN: 6.2500 mg | INTRAMUSCULAR | Status: DC | PRN

## 2019-10-04 MED ORDER — BUPIVACAINE LIPOSOME 1.3 % IJ SUSP
20.0000 mL | Freq: Once | INTRAMUSCULAR | Status: AC
  Administered 2019-10-04: 20 mL
  Filled 2019-10-04: qty 20

## 2019-10-04 MED ORDER — HYDROCODONE-ACETAMINOPHEN 5-325 MG PO TABS: 1.0000 | ORAL_TABLET | Freq: Four times a day (QID) | ORAL | 0 refills | Status: DC | PRN

## 2019-10-04 MED ORDER — STERILE WATER FOR IRRIGATION IR SOLN
Status: DC | PRN
  Administered 2019-10-04: 1000 mL

## 2019-10-04 MED ORDER — LACTATED RINGERS IV SOLN: INTRAVENOUS | Status: DC

## 2019-10-04 MED ORDER — EPHEDRINE SULFATE 50 MG/ML IJ SOLN
INTRAMUSCULAR | Status: DC | PRN
  Administered 2019-10-04: 10 mg via INTRAVENOUS
  Administered 2019-10-04: 5 mg via INTRAVENOUS
  Administered 2019-10-04: 10 mg via INTRAVENOUS

## 2019-10-04 MED ORDER — HYDROMORPHONE HCL 1 MG/ML IJ SOLN
0.2500 mg | INTRAMUSCULAR | Status: DC | PRN
  Administered 2019-10-04: 0.5 mg via INTRAVENOUS
  Administered 2019-10-04: 0.25 mg via INTRAVENOUS

## 2019-10-04 MED ORDER — DOCUSATE SODIUM 100 MG PO CAPS
100.0000 mg | ORAL_CAPSULE | Freq: Two times a day (BID) | ORAL | Status: DC
  Administered 2019-10-04 – 2019-10-06 (×4): 100 mg via ORAL
  Filled 2019-10-04 (×4): qty 1

## 2019-10-04 MED ORDER — FENTANYL CITRATE (PF) 250 MCG/5ML IJ SOLN
INTRAMUSCULAR | Status: DC | PRN
  Administered 2019-10-04 (×2): 50 ug via INTRAVENOUS
  Administered 2019-10-04: 100 ug via INTRAVENOUS
  Administered 2019-10-04: 50 ug via INTRAVENOUS

## 2019-10-04 MED ORDER — DEXTROSE-NACL 5-0.45 % IV SOLN: INTRAVENOUS | Status: DC

## 2019-10-04 MED ORDER — FENTANYL CITRATE (PF) 100 MCG/2ML IJ SOLN
25.0000 ug | INTRAMUSCULAR | Status: DC | PRN
  Administered 2019-10-04 (×2): 50 ug via INTRAVENOUS

## 2019-10-04 MED ORDER — ONDANSETRON HCL 4 MG/2ML IJ SOLN
4.0000 mg | INTRAMUSCULAR | Status: DC | PRN
  Administered 2019-10-04 – 2019-10-05 (×2): 4 mg via INTRAVENOUS
  Filled 2019-10-04 (×2): qty 2

## 2019-10-04 MED ORDER — EPHEDRINE 5 MG/ML INJ
INTRAVENOUS | Status: AC
  Filled 2019-10-04: qty 10

## 2019-10-04 MED ORDER — OXYCODONE HCL 5 MG PO TABS: 5.0000 mg | ORAL_TABLET | Freq: Once | ORAL | Status: DC | PRN

## 2019-10-04 MED ORDER — FENTANYL CITRATE (PF) 250 MCG/5ML IJ SOLN
INTRAMUSCULAR | Status: AC
  Filled 2019-10-04: qty 5

## 2019-10-04 MED ORDER — FAMOTIDINE 20 MG PO TABS
40.0000 mg | ORAL_TABLET | Freq: Every evening | ORAL | Status: DC | PRN
  Administered 2019-10-04: 40 mg via ORAL
  Filled 2019-10-04: qty 2

## 2019-10-04 MED ORDER — HYDROMORPHONE HCL 1 MG/ML IJ SOLN
INTRAMUSCULAR | Status: AC
  Administered 2019-10-04: 0.25 mg via INTRAVENOUS
  Filled 2019-10-04: qty 2

## 2019-10-04 MED ORDER — PROPOFOL 10 MG/ML IV BOLUS
INTRAVENOUS | Status: AC
  Filled 2019-10-04: qty 20

## 2019-10-04 MED ORDER — ROCURONIUM BROMIDE 10 MG/ML (PF) SYRINGE
PREFILLED_SYRINGE | INTRAVENOUS | Status: AC
  Filled 2019-10-04: qty 10

## 2019-10-04 MED ORDER — SODIUM CHLORIDE (PF) 0.9 % IJ SOLN
INTRAMUSCULAR | Status: AC
  Filled 2019-10-04: qty 50

## 2019-10-04 MED ORDER — LIDOCAINE HCL (CARDIAC) PF 100 MG/5ML IV SOSY
PREFILLED_SYRINGE | INTRAVENOUS | Status: DC | PRN
  Administered 2019-10-04: 100 mg via INTRAVENOUS

## 2019-10-04 MED ORDER — ACETAMINOPHEN 325 MG PO TABS: 325.0000 mg | ORAL_TABLET | ORAL | Status: DC | PRN

## 2019-10-04 MED ORDER — PANTOPRAZOLE SODIUM 40 MG PO TBEC
40.0000 mg | DELAYED_RELEASE_TABLET | Freq: Every day | ORAL | Status: DC
  Administered 2019-10-04 – 2019-10-05 (×2): 40 mg via ORAL
  Filled 2019-10-04 (×3): qty 1

## 2019-10-04 MED ORDER — MIDAZOLAM HCL 2 MG/2ML IJ SOLN
INTRAMUSCULAR | Status: AC
  Filled 2019-10-04: qty 2

## 2019-10-04 MED ORDER — OXYCODONE HCL 5 MG PO TABS
5.0000 mg | ORAL_TABLET | ORAL | Status: DC | PRN
  Administered 2019-10-04 – 2019-10-06 (×6): 5 mg via ORAL
  Filled 2019-10-04 (×8): qty 1

## 2019-10-04 MED ORDER — ACETAMINOPHEN 160 MG/5ML PO SOLN: 325.0000 mg | ORAL | Status: DC | PRN

## 2019-10-04 MED ORDER — KETAMINE HCL 10 MG/ML IJ SOLN
INTRAMUSCULAR | Status: AC
  Filled 2019-10-04: qty 1

## 2019-10-04 MED ORDER — SUGAMMADEX SODIUM 200 MG/2ML IV SOLN
INTRAVENOUS | Status: DC | PRN
  Administered 2019-10-04: 150 mg via INTRAVENOUS

## 2019-10-04 MED ORDER — FENTANYL CITRATE (PF) 100 MCG/2ML IJ SOLN
INTRAMUSCULAR | Status: AC
  Administered 2019-10-04: 50 ug via INTRAVENOUS
  Filled 2019-10-04: qty 4

## 2019-10-04 MED ORDER — ROCURONIUM BROMIDE 100 MG/10ML IV SOLN
INTRAVENOUS | Status: DC | PRN
  Administered 2019-10-04: 100 mg via INTRAVENOUS
  Administered 2019-10-04: 10 mg via INTRAVENOUS

## 2019-10-04 MED ORDER — ACETAMINOPHEN 10 MG/ML IV SOLN
1000.0000 mg | Freq: Once | INTRAVENOUS | Status: AC
  Administered 2019-10-04: 1000 mg via INTRAVENOUS

## 2019-10-04 MED ORDER — CEFAZOLIN SODIUM-DEXTROSE 2-4 GM/100ML-% IV SOLN
2.0000 g | INTRAVENOUS | Status: AC
  Administered 2019-10-04: 2 g via INTRAVENOUS
  Filled 2019-10-04: qty 100

## 2019-10-04 MED ORDER — BELLADONNA ALKALOIDS-OPIUM 16.2-60 MG RE SUPP: 1.0000 | Freq: Four times a day (QID) | RECTAL | Status: DC | PRN

## 2019-10-04 MED ORDER — ONDANSETRON HCL 4 MG/2ML IJ SOLN: 4.0000 mg | Freq: Once | INTRAMUSCULAR | Status: DC | PRN

## 2019-10-04 MED ORDER — SODIUM CHLORIDE (PF) 0.9 % IJ SOLN
INTRAMUSCULAR | Status: DC | PRN
  Administered 2019-10-04: 20 mL

## 2019-10-04 MED ORDER — LIDOCAINE HCL 2 % IJ SOLN
INTRAMUSCULAR | Status: AC
  Filled 2019-10-04: qty 20

## 2019-10-04 MED ORDER — ACETAMINOPHEN 10 MG/ML IV SOLN
INTRAVENOUS | Status: AC
  Filled 2019-10-04: qty 100

## 2019-10-04 MED ORDER — HYDROMORPHONE HCL 1 MG/ML IJ SOLN
0.5000 mg | INTRAMUSCULAR | Status: DC | PRN
  Administered 2019-10-04: 1 mg via INTRAVENOUS
  Administered 2019-10-04: 0.5 mg via INTRAVENOUS
  Administered 2019-10-05 (×2): 1 mg via INTRAVENOUS
  Filled 2019-10-04 (×4): qty 1

## 2019-10-04 MED ORDER — DIPHENHYDRAMINE HCL 50 MG/ML IJ SOLN: 12.5000 mg | Freq: Four times a day (QID) | INTRAMUSCULAR | Status: DC | PRN

## 2019-10-04 SURGICAL SUPPLY — 57 items
BAG LAPAROSCOPIC 12 15 PORT 16 (BASKET) ×1 IMPLANT
BAG RETRIEVAL 12/15 (BASKET) ×2
CHLORAPREP W/TINT 26 (MISCELLANEOUS) ×2 IMPLANT
CLIP VESOLOCK LG 6/CT PURPLE (CLIP) ×2 IMPLANT
CLIP VESOLOCK MED LG 6/CT (CLIP) ×2 IMPLANT
CLIP VESOLOCK XL 6/CT (CLIP) ×2 IMPLANT
COVER SURGICAL LIGHT HANDLE (MISCELLANEOUS) ×2 IMPLANT
COVER TIP SHEARS 8 DVNC (MISCELLANEOUS) ×1 IMPLANT
COVER TIP SHEARS 8MM DA VINCI (MISCELLANEOUS) ×1
COVER WAND RF STERILE (DRAPES) ×2 IMPLANT
CUTTER ECHEON FLEX ENDO 45 340 (ENDOMECHANICALS) ×2 IMPLANT
DECANTER SPIKE VIAL GLASS SM (MISCELLANEOUS) ×2 IMPLANT
DERMABOND ADVANCED (GAUZE/BANDAGES/DRESSINGS) ×1
DERMABOND ADVANCED .7 DNX12 (GAUZE/BANDAGES/DRESSINGS) ×1 IMPLANT
DRAIN CHANNEL 15F RND FF 3/16 (WOUND CARE) IMPLANT
DRAPE ARM DVNC X/XI (DISPOSABLE) ×4 IMPLANT
DRAPE COLUMN DVNC XI (DISPOSABLE) ×1 IMPLANT
DRAPE DA VINCI XI ARM (DISPOSABLE) ×4
DRAPE DA VINCI XI COLUMN (DISPOSABLE) ×1
DRAPE INCISE IOBAN 66X45 STRL (DRAPES) ×2 IMPLANT
DRAPE LAPAROSCOPIC ABDOMINAL (DRAPES) IMPLANT
DRAPE SHEET LG 3/4 BI-LAMINATE (DRAPES) ×2 IMPLANT
ELECT REM PT RETURN 15FT ADLT (MISCELLANEOUS) ×2 IMPLANT
EVACUATOR SILICONE 100CC (DRAIN) IMPLANT
GLOVE BIO SURGEON STRL SZ 6.5 (GLOVE) ×2 IMPLANT
GLOVE BIOGEL M STRL SZ7.5 (GLOVE) ×4 IMPLANT
GOWN STRL REUS W/TWL LRG LVL3 (GOWN DISPOSABLE) ×6 IMPLANT
IRRIG SUCT STRYKERFLOW 2 WTIP (MISCELLANEOUS) ×2
IRRIGATION SUCT STRKRFLW 2 WTP (MISCELLANEOUS) ×1 IMPLANT
KIT BASIN OR (CUSTOM PROCEDURE TRAY) ×2 IMPLANT
KIT TURNOVER KIT A (KITS) IMPLANT
LOOP VESSEL MAXI BLUE (MISCELLANEOUS) IMPLANT
NEEDLE INSUFFLATION 14GA 120MM (NEEDLE) ×2 IMPLANT
PENCIL SMOKE EVACUATOR (MISCELLANEOUS) IMPLANT
PORT ACCESS TROCAR AIRSEAL 12 (TROCAR) ×1 IMPLANT
PORT ACCESS TROCAR AIRSEAL 5M (TROCAR) ×1
PROTECTOR NERVE ULNAR (MISCELLANEOUS) ×4 IMPLANT
SEAL CANN UNIV 5-8 DVNC XI (MISCELLANEOUS) ×4 IMPLANT
SEAL XI 5MM-8MM UNIVERSAL (MISCELLANEOUS) ×4
SET TRI-LUMEN FLTR TB AIRSEAL (TUBING) ×2 IMPLANT
SET TUBE SMOKE EVAC HIGH FLOW (TUBING) ×2 IMPLANT
SOLUTION ELECTROLUBE (MISCELLANEOUS) ×2 IMPLANT
SPONGE LAP 4X18 RFD (DISPOSABLE) ×2 IMPLANT
STAPLE RELOAD 45 WHT (STAPLE) ×5 IMPLANT
STAPLE RELOAD 45MM WHITE (STAPLE) ×5
SUT ETHILON 3 0 PS 1 (SUTURE) IMPLANT
SUT MNCRL AB 4-0 PS2 18 (SUTURE) ×4 IMPLANT
SUT PDS AB 1 CT1 27 (SUTURE) ×6 IMPLANT
SUT VICRYL 0 UR6 27IN ABS (SUTURE) IMPLANT
TOWEL OR 17X26 10 PK STRL BLUE (TOWEL DISPOSABLE) ×2 IMPLANT
TOWEL OR NON WOVEN STRL DISP B (DISPOSABLE) ×2 IMPLANT
TRAY FOLEY MTR SLVR 16FR STAT (SET/KITS/TRAYS/PACK) ×2 IMPLANT
TRAY LAPAROSCOPIC (CUSTOM PROCEDURE TRAY) ×2 IMPLANT
TROCAR BLADELESS OPT 5 100 (ENDOMECHANICALS) IMPLANT
TROCAR UNIVERSAL OPT 12M 100M (ENDOMECHANICALS) ×2 IMPLANT
TROCAR XCEL 12X100 BLDLESS (ENDOMECHANICALS) ×2 IMPLANT
WATER STERILE IRR 1000ML POUR (IV SOLUTION) ×2 IMPLANT

## 2019-10-04 NOTE — Anesthesia Postprocedure Evaluation (Signed)
Anesthesia Post Note  Patient: Sherry Fernandez  Procedure(s) Performed: XI ROBOTIC ASSISTED LAPAROSCOPIC NEPHRECTOMY (Left )     Patient location during evaluation: PACU Anesthesia Type: General Level of consciousness: awake and alert Pain management: pain level controlled Vital Signs Assessment: post-procedure vital signs reviewed and stable Respiratory status: spontaneous breathing, nonlabored ventilation, respiratory function stable and patient connected to nasal cannula oxygen Cardiovascular status: blood pressure returned to baseline and stable Postop Assessment: no apparent nausea or vomiting Anesthetic complications: no    Last Vitals:  Vitals:   10/04/19 1537 10/04/19 2022  BP: (!) 142/79 134/71  Pulse: 81 92  Resp:  17  Temp:  37.1 C  SpO2: 92% 94%    Last Pain:  Vitals:   10/04/19 2100  TempSrc:   PainSc: 3                  Joshuwa Vecchio

## 2019-10-04 NOTE — Transfer of Care (Signed)
Immediate Anesthesia Transfer of Care Note  Patient: Sherry Fernandez  Procedure(s) Performed: XI ROBOTIC ASSISTED LAPAROSCOPIC NEPHRECTOMY (Left )  Patient Location: PACU  Anesthesia Type:General  Level of Consciousness: drowsy, patient cooperative and responds to stimulation  Airway & Oxygen Therapy: Patient Spontanous Breathing and Patient connected to face mask oxygen  Post-op Assessment: Report given to RN and Post -op Vital signs reviewed and stable  Post vital signs: Reviewed and stable  Last Vitals:  Vitals Value Taken Time  BP 113/64 10/04/19 1045  Temp    Pulse 67 10/04/19 1047  Resp 20 10/04/19 1047  SpO2 100 % 10/04/19 1047  Vitals shown include unvalidated device data.  Last Pain:  Vitals:   10/04/19 0709  TempSrc:   PainSc: 0-No pain         Complications: No apparent anesthesia complications

## 2019-10-04 NOTE — H&P (Signed)
Sherry Fernandez is an 59 y.o. female.    Chief Complaint: Pre-OP LEFT Radical Nephrectomy  HPI:   1 - Left Renal Mass - 5cm left upper pole cystic-solid mass with enhancement by CT 08/2019 on eval back pain. Approx 50% exophytic with very diffuse interface with hilar fat and some peritumor stranding (?early T3). Chest/cardiac CT and CXR late 2020 w/o chest lesions. 1 artery / 1 vein (large lumbar below renal artery, large gonadal) left renovascular anatomy. LLP 1.5cm mildly complex cyst as well (some wall enhancment).    PMH sig for migraine. NO ischemic CV disease / blood thinners. 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 Her PCP is Deland Pretty MD.   Today "Joel" is seen to proceed with LEFT radical nephrectomy. C19 screen negative. No interval fevers.     Past Medical History:  Diagnosis Date  . Arthritis   . Cancer ([place])    lt. kidney  . Dyspnea    when hiking  . FH: CAD (coronary artery disease)   . HTN (hypertension)   . Hypercholesterolemia   . Hypothyroidism   . Migraine   . Pre-diabetes    manage with diet    Past Surgical History:  Procedure Laterality Date  . bladder  N/A   . HERNIA REPAIR    . REFRACTIVE SURGERY Bilateral   . right knee arthrocentesis    . thyroid nodule biopsy      Family History  Problem Relation Age of Onset  . Heart failure Father 65  . Sudden death Paternal Grandmother   . CAD Sister   . CAD Daughter        Pt reports that she got 8 stents  . Cardiomyopathy Daughter        got an ICD   Social History:  reports that she has never smoked. She has never used smokeless tobacco. She reports current alcohol use. She reports that she does not use drugs.  Allergies:  Allergies  Allergen Reactions  . Sulfamethoxazole Swelling    Medications Prior to Admission  Medication Sig Dispense Refill  . DEXILANT 60 MG capsule Take 60 mg by mouth daily before breakfast.    . famotidine (PEPCID) 40 MG  tablet Take 40 mg by mouth at bedtime as needed for heartburn or indigestion.    . Fremanezumab-vfrm (AJOVY) 225 MG/1.5ML SOSY Inject 1.5 mLs into the skin every 30 (thirty) days. migrane prevention    . rizatriptan (MAXALT) 10 MG tablet Take 10 mg by mouth every 2 (two) hours as needed for migraine. May repeat in 2 hours if needed       No results found for this or any previous visit (from the past 48 hour(s)). No results found.  Review of Systems  Constitutional: Negative for fever.  All other systems reviewed and are negative.   There were no vitals taken for this visit. Physical Exam  Constitutional: She appears well-developed.  HENT:  Head: Normocephalic.  Eyes: Pupils are equal, round, and reactive to light.  Cardiovascular: Normal rate.  Respiratory: Effort normal.  GI: Soft.  Genitourinary:    Genitourinary Comments: No CVAT at present   Musculoskeletal:        General: Normal range of motion.     Cervical back: Normal range of motion.  Neurological: She is alert.  Skin: Skin is warm.  Psychiatric: She has a normal mood and affect.     Assessment/Plan  1 - Left Renal Mass -  proceed as planned with LEFT robotic radical nephrectomy. Risks, benefits, alternatives, expected peri-op course discussed previously and reiterated today.   Alexis Frock, MD 10/04/2019, 6:38 AM

## 2019-10-04 NOTE — Anesthesia Procedure Notes (Signed)
Procedure Name: Intubation Date/Time: 10/04/2019 8:22 AM Performed by: Glory Buff, CRNA Pre-anesthesia Checklist: Patient identified, Emergency Drugs available, Suction available and Patient being monitored Patient Re-evaluated:Patient Re-evaluated prior to induction Oxygen Delivery Method: Circle system utilized Preoxygenation: Pre-oxygenation with 100% oxygen Induction Type: IV induction Ventilation: Mask ventilation without difficulty Laryngoscope Size: Miller and 3 Grade View: Grade I Tube type: Oral Tube size: 7.0 mm Number of attempts: 1 Airway Equipment and Method: Stylet and Oral airway Placement Confirmation: ETT inserted through vocal cords under direct vision,  positive ETCO2 and breath sounds checked- equal and bilateral Secured at: 20 cm Tube secured with: Tape Dental Injury: Teeth and Oropharynx as per pre-operative assessment

## 2019-10-04 NOTE — Op Note (Signed)
NAME: Sherry Fernandez, Sherry Fernandez [id number] ACCOUNT [id number] DATE OF BIRTH:11-Nov-1960 FACILITY: [facility] LOCATION: [facility] PHYSICIAN:Jospeh Mangel Tresa Moore, MD  OPERATIVE REPORT  DATE OF PROCEDURE:  10/04/2019  SURGEON:  Alexis Frock MD  PREOPERATIVE DIAGNOSIS:  Left renal mass.  PROCEDURE:  Robotic-assisted laparoscopic left radical nephrectomy.  ESTIMATED BLOOD LOSS:  100 mL.  COMPLICATIONS:  None.  SPECIMEN:  Left radical nephrectomy for pathology.  FINDINGS:  Single artery, single early branching vein with large lumbar vein, enlarged gonadal left renal vascular anatomy.  DRAINS:  Foley catheter to straight drain.  ASSISTANT:  Debbrah Alar, PA  INDICATIONS:  The patient is a very pleasant 59 year old woman who was found incidentally to have an enhancing solid left upper pole renal mass concerning for localized renal cell carcinoma.  Staging imaging corroborated localized disease.  Renal  function is normal.  The mass is not exceptionally large, however, it was quite endophytic, encroaching upon hilar fat and with some perinephric stranding concerning for possible early T3 disease.  Options were discussed for management including  surveillance protocols versus ablative therapy versus nephron-sparing versus nonnephron-sparing surgery.  We agreed upon left radical nephrectomy as being treatment of choice.  She wished to proceed.  Informed consent was obtained and placed in the  medical record.  DESCRIPTION OF PROCEDURE:  The patient being identified, the procedure being left radical nephrectomy was confirmed, procedure timeout was performed.  Intravenous antibiotics administered.  General endotracheal anesthesia induced.  Foley catheter was  placed per urethra to straight drain.  The patient was placed into a left side up full flank position, pulling 15 degrees of table flexion, superior arm elevator, axillary roll, sequential compression devices, bottom leg bent, top leg straight.   She was  further fastened to operative table using beanbag and taping across her supraxiphoid chest and pelvis.  All bony prominences were padded.  Sterile field was created, prepping and draping the entire left flank and abdomen using chlorhexidine gluconate and  a high-flow, low-pressure pneumoperitoneum was obtained using Veress technique in the left lower quadrant, having passed the aspiration and drop test.  Next, an 8 mm robotic camera port was placed in position approximately 4 fingerbreadths superolateral  to the umbilicus.  Laparoscopic examination of the peritoneal cavity revealed no significant adhesions and no visceral injury.  Distal ports were placed as follows:  Left subcostal 8 mm robotic port, left far lateral 8 mm robotic port approximately 3  fingerbreadths superior and medial to the anterior iliac spine, left paramedian inferior robotic port approximately 1 handbreadth superior to the pubic ramus and two 12 mm assistant port sites in the midline, an inferior one at the supraumbilical crease  and a superior one approximately 1 fingerbreadth superior to the plane of the camera port, this one being AirSeal type.  The robot was docked and passed the electronic checks.  Initial attention was directed at development of the retroperitoneum.   Incision was made lateral to the ascending colon from the area of the splenic flexure towards the area of the internal ring and left ovary.  The colon was carefully swept medially.  This exposed the anterior surface of Gerota fascia and the plane between  the anterior surface of Gerota fascia and the spleen was carefully developed and lateral attachments of the spleen were taken down, allowing the spleen and pancreas to rotate medially away from the anterior surface of Gerota fascia, thus  self-retracting.  The lower pole kidney area was identified, placed on gentle lateral traction.  Dissection  proceeded medial to this.  The ureter and gonadal vessels were  encountered.  The gonadal vein was quite large caliber as anticipated.   The  uterine vessels were placed on gentle lateral traction.  Dissection proceeded within this triangle towards the renal hilum.  Renal hilum consisted of early branching vein with prominent lower lumbar vein and single artery renal vascular anatomy as  anticipated.  The lumbar vessel was controlled using a vascular stapler, which then allowed much better exposure to the renal artery which was circumferentially mobilized and controlled using an extra-large Hem-o-Lok clip proximal, stapler load distal  and then the main vein trunk controlled using vascular stapler.  Dissection proceeded just lateral to the aorta superiorly, thus performing complete adrenal resection and superomedial adrenal attachments were taken down using vascular stapler, taking  down the inferior phrenic and medial adrenal vessels.  Superior attachments were taken down with cautery scissors, as were the lateral attachments.  The ureter was doubly clipped and ligated, as was the large gonadal vein inferiorly.  This completely  freed up the left radical nephrectomy specimen.  It was then placed in an extra-large EndoCatch bag for later retrieval.  Hemostasis appeared excellent.  Sponge, needle counts were correct.  Robot was then undocked.  Specimen was retrieved by extending  the previous assistant port sites in the midline, removing the radical nephrectomy specimen, setting it aside for permanent pathology.  The extraction site was closed at the fascia using figure-of-eight PDS x5, followed by reapproximation of Scarpa's  with running Vicryl.  All incision sites were infiltrated with dilute lipolyzed Marcaine and closed at the level of the skin using subcuticular Monocryl, followed by Dermabond.  The procedure was then terminated.  The patient tolerated the procedure  well.  No immediate perioperative complications.  The patient was taken to postanesthesia care in  stable condition.  Please note, first assistant Debbrah Alar was crucial for all portions of the surgery today.  She provided invaluable vascular clipping, vascular stapling, specimen manipulation, retraction and general first assistance.  VN/NUANCE  D:10/04/2019 T:10/04/2019 JOB:[id number]

## 2019-10-04 NOTE — Plan of Care (Signed)
Progressing well. Tolerating pain medication and ambualting as instructed - safety measures in place

## 2019-10-04 NOTE — Brief Op Note (Signed)
10/04/2019  10:30 AM  PATIENT:  Rosina Lowenstein  59 y.o. female  PRE-OPERATIVE DIAGNOSIS:  LEFT RENAL MASS  POST-OPERATIVE DIAGNOSIS:  LEFT RENAL MASS  PROCEDURE:  Procedure(s) with comments: XI ROBOTIC ASSISTED LAPAROSCOPIC NEPHRECTOMY (Left) - 3 HRS  SURGEON:  Surgeon(s) and Role:    Alexis Frock, MD - Primary  PHYSICIAN ASSISTANT:   ASSISTANTS: Debbrah Alar PA   ANESTHESIA:   local and general  EBL:  100 mL   BLOOD ADMINISTERED:none  DRAINS: foley to gravity   LOCAL MEDICATIONS USED:  MARCAINE     SPECIMEN:  Source of Specimen:  left radical nephrectomy  DISPOSITION OF SPECIMEN:  PATHOLOGY  COUNTS:  YES  TOURNIQUET:  * No tourniquets in log *  DICTATION: .Other Dictation: Dictation Number [id number]  PLAN OF CARE: Admit to inpatient   PATIENT DISPOSITION:  PACU - hemodynamically stable.   Delay start of Pharmacological VTE agent (>24hrs) due to surgical blood loss or risk of bleeding: yes

## 2019-10-05 LAB — BASIC METABOLIC PANEL
Anion gap: 8 (ref 5–15)
BUN: 22 mg/dL — ABNORMAL HIGH (ref 6–20)
CO2: 27 mmol/L (ref 22–32)
Calcium: 9.2 mg/dL (ref 8.9–10.3)
Chloride: 101 mmol/L (ref 98–111)
Creatinine, Ser: 1.57 mg/dL — ABNORMAL HIGH (ref 0.44–1.00)
GFR calc Af Amer: 41 mL/min — ABNORMAL LOW (ref >60)
GFR calc non Af Amer: 35 mL/min — ABNORMAL LOW (ref >60)
Glucose, Bld: 118 mg/dL — ABNORMAL HIGH (ref 70–99)
Potassium: 4.1 mmol/L (ref 3.5–5.1)
Sodium: 136 mmol/L (ref 135–145)

## 2019-10-05 LAB — HEMOGLOBIN AND HEMATOCRIT, BLOOD
HCT: 38 % (ref 36.0–46.0)
Hemoglobin: 12.6 g/dL (ref 12.0–15.0)

## 2019-10-05 LAB — TYPE AND SCREEN
ABO/RH(D): O POS
Antibody Screen: NEGATIVE

## 2019-10-05 MED ORDER — HYDROMORPHONE HCL 1 MG/ML IJ SOLN
1.0000 mg | INTRAMUSCULAR | Status: DC | PRN
  Administered 2019-10-05 – 2019-10-06 (×3): 1 mg via INTRAVENOUS
  Filled 2019-10-05 (×3): qty 1

## 2019-10-05 MED ORDER — HYDROMORPHONE HCL 1 MG/ML IJ SOLN
1.0000 mg | INTRAMUSCULAR | Status: DC | PRN
  Administered 2019-10-05: 1 mg via INTRAVENOUS
  Filled 2019-10-05: qty 1

## 2019-10-05 MED ORDER — OXYCODONE HCL 5 MG PO TABS
5.0000 mg | ORAL_TABLET | Freq: Once | ORAL | Status: AC
  Administered 2019-10-05: 5 mg via ORAL
  Filled 2019-10-05: qty 1

## 2019-10-05 MED ORDER — LIDOCAINE 5 % EX PTCH
1.0000 | MEDICATED_PATCH | Freq: Every day | CUTANEOUS | Status: DC
  Administered 2019-10-05 – 2019-10-06 (×2): 1 via TRANSDERMAL
  Filled 2019-10-05 (×2): qty 1

## 2019-10-05 MED ORDER — HYDROMORPHONE HCL 1 MG/ML IJ SOLN
1.0000 mg | INTRAMUSCULAR | Status: AC | PRN
  Administered 2019-10-05: 1 mg via INTRAVENOUS
  Filled 2019-10-05: qty 1

## 2019-10-05 MED ORDER — DIAZEPAM 5 MG PO TABS
5.0000 mg | ORAL_TABLET | Freq: Three times a day (TID) | ORAL | Status: DC | PRN
  Administered 2019-10-05: 5 mg via ORAL
  Filled 2019-10-05: qty 1

## 2019-10-05 NOTE — Discharge Summary (Signed)
[facility] Discharge Summary  Admit date: 10/04/2019  Discharge date and time: 10/06/19   Discharge to: Home  Discharge Service: Urology  Discharge Attending Physician:  Tresa Moore  Discharge  Diagnoses: Left renal mass  OR Procedures: Procedure(s): XI ROBOTIC ASSISTED LAPAROSCOPIC NEPHRECTOMY 10/04/2019   Ancillary Procedures: None   Discharge Day Services: The patient was seen and examined by the Urology team both in the morning and immediately prior to discharge.  Vital signs and laboratory values were stable and within normal limits.  The physical exam was benign and unchanged and all surgical wounds were examined.  Discharge instructions were explained and all questions answered.  Subjective  No acute events overnight. Pain Controlled. No fever or chills. Voiding volitionally. Ambulated  Objective Patient Vitals for the past 8 hrs:  BP Temp Temp src Pulse Resp SpO2  10/06/19 1305 (!) 158/91 98.2 F (36.8 C) Oral 80 18 94 %   Total I/O In: 120 [P.O.:120] Out: 550 [Urine:550]  General Appearance:        No acute distress Lungs:                       Normal work of breathing on room air Heart:                                Regular rate and rhythm Abdomen:                         Soft, non-tender, non-distended. Incisions c/d/i w/ dermabond. Bruising developing around extraction site.  Extremities:                      Warm and well perfused   Hospital Course:  60 y.o. healthy female with left renal mass not amenable to partial nephrectomy.  The patient underwent Robotic left radical nephrectomy on 10/04/2019. The procedure was uncomplicated.  The patient tolerated the procedure well, was extubated in the OR, and afterwards was taken to the PACU for routine post-surgical care. When stable the patient was transferred to the floor.     The patient initially had pain requiring IV medications but overall did well postoperatively.  The patient was discharged home 1 Day  Post-Op, at which point was tolerating a regular solid diet, was able to void spontaneously, have adequate pain control with P.O. pain medication, and could ambulate without difficulty.   The patient will follow up with [place] for post op check. Scheduled for 2/18  Condition at Discharge: Improved  Discharge Medications:  Allergies as of 10/06/2019      Reactions   Sulfamethoxazole Swelling      Medication List    TAKE these medications   Ajovy 225 MG/1.5ML Sosy Generic drug: Fremanezumab-vfrm Inject 1.5 mLs into the skin every 30 (thirty) days. migrane prevention   ciprofloxacin 500 MG tablet Commonly known as: CIPRO Take 500 mg by mouth 2 (two) times daily.   cyclobenzaprine 5 MG tablet Commonly known as: FLEXERIL Take 1 tablet (5 mg total) by mouth 3 (three) times daily as needed for up to 5 days for muscle spasms.   Dexilant 60 MG capsule Generic drug: dexlansoprazole Take 60 mg by mouth daily before breakfast.   famotidine 40 MG tablet Commonly known as: PEPCID Take 40 mg by mouth at bedtime as needed for heartburn or indigestion.   HYDROmorphone 2 MG tablet Commonly known  as: DILAUDID Take 1 tablet (2 mg total) by mouth every 4 (four) hours as needed for up to 5 days for moderate pain.   rizatriptan 10 MG tablet Commonly known as: MAXALT Take 10 mg by mouth every 2 (two) hours as needed for migraine. May repeat in 2 hours if needed

## 2019-10-05 NOTE — Progress Notes (Signed)
Urology Progress Note   1 Day Post-Op s/p robotic left radical nephrectomy  Subjective: Pain overnight and today requiring dilaudid. Describes this as muscle spasm pain, almost like a contraction. Otherwise passed TOV and has ambulated. Tolerating PO. Will stay for pain  Objective: Vital signs in last 24 hours: Temp:  [98 F (36.7 C)-99 F (37.2 C)] 99 F (37.2 C) (02/04 1337) Pulse Rate:  [67-80] 67 (02/04 1337) Resp:  [16-18] 18 (02/04 1337) BP: (110-155)/(60-89) 155/89 (02/04 1337) SpO2:  [93 %-95 %] 95 % (02/04 1337)  Intake/Output from previous day: 02/03 0701 - 02/04 0700 In: 2999.4 [P.O.:600; I.V.:2299.4; IV Piggyback:100] Out: 685 [Urine:585; Blood:100] Intake/Output this shift: No intake/output data recorded.  Physical Exam:  General: Alert and oriented CV: RRR Lungs: Normal work of breathing Abdomen: Soft, very tender at extraction site [id number] volitionally Ext: NT, No erythema  Lab Results: Recent Labs    10/04/19 1048 10/05/19 0526  HGB 13.5 12.6  HCT 41.3 38.0   BMET Recent Labs    10/05/19 0526  NA 136  K 4.1  CL 101  CO2 27  GLUCOSE 118*  BUN 22*  CREATININE 1.57*  CALCIUM 9.2     Studies/Results: No results found.  Assessment/Plan:  59 y.o. female s/p robotic left nephrectomy. Labs and vitals are WNL but need to stay for further pain control  - Stop fluids - regular diet - IV dilaudid, scheduled tylenol, ice packs, oxycodone PRN - Add valium PRN for muscle spasms   Dispo: Floor   LOS: 1 day   Tharon Aquas 10/05/2019, 8:29 PM

## 2019-10-05 NOTE — Progress Notes (Signed)
Paged Dr Anthony Sar regarding patient's pain medication for clarification on PO requirement before IV. Patient does not want to try the oxycodone and requesting the orders be changed. Awaiting orders.

## 2019-10-06 LAB — SURGICAL PATHOLOGY

## 2019-10-06 MED ORDER — HYDROMORPHONE HCL 2 MG PO TABS
2.0000 mg | ORAL_TABLET | ORAL | Status: DC | PRN
  Administered 2019-10-06 (×3): 2 mg via ORAL
  Filled 2019-10-06 (×3): qty 1

## 2019-10-06 MED ORDER — CYCLOBENZAPRINE HCL 5 MG PO TABS
5.0000 mg | ORAL_TABLET | Freq: Three times a day (TID) | ORAL | Status: DC
  Administered 2019-10-06 (×2): 5 mg via ORAL
  Filled 2019-10-06 (×2): qty 1

## 2019-10-06 MED ORDER — SUMATRIPTAN SUCCINATE 50 MG PO TABS
50.0000 mg | ORAL_TABLET | Freq: Once | ORAL | Status: AC
  Administered 2019-10-06: 50 mg via ORAL
  Filled 2019-10-06: qty 1

## 2019-10-06 MED ORDER — ACETAMINOPHEN 500 MG PO TABS
1000.0000 mg | ORAL_TABLET | Freq: Four times a day (QID) | ORAL | Status: DC
  Administered 2019-10-06: 1000 mg via ORAL
  Filled 2019-10-06 (×3): qty 2

## 2019-10-06 MED ORDER — CYCLOBENZAPRINE HCL 5 MG PO TABS: 5.0000 mg | ORAL_TABLET | Freq: Three times a day (TID) | ORAL | 0 refills | Status: DC | PRN

## 2019-10-06 MED ORDER — CYCLOBENZAPRINE HCL 5 MG PO TABS: 5.0000 mg | ORAL_TABLET | Freq: Three times a day (TID) | ORAL | 0 refills | Status: AC | PRN

## 2019-10-06 MED ORDER — SENNOSIDES-DOCUSATE SODIUM 8.6-50 MG PO TABS: 1.0000 | ORAL_TABLET | Freq: Two times a day (BID) | ORAL | 0 refills | Status: DC

## 2019-10-06 MED ORDER — HYDROMORPHONE HCL 2 MG PO TABS: 2.0000 mg | ORAL_TABLET | ORAL | 0 refills | Status: AC | PRN

## 2019-10-06 MED ORDER — HYDROMORPHONE HCL 2 MG PO TABS: 2.0000 mg | ORAL_TABLET | ORAL | 0 refills | Status: DC | PRN

## 2019-10-06 NOTE — Progress Notes (Signed)
Urology Progress Note   2 Days Post-Op s/p robotic left radical nephrectomy  Subjective: Continued issues with pain control requiring IV Dilaudid.  Ambulating, voiding, tolerating clear liquids.  T-max 37.9.  Using incentive spirometer  Objective: Vital signs in last 24 hours: Temp:  [98 F (36.7 C)-100.2 F (37.9 C)] 100.2 F (37.9 C) (02/05 0602) Pulse Rate:  [67-85] 85 (02/05 0602) Resp:  [16-18] 18 (02/05 0602) BP: (128-155)/(66-89) 128/66 (02/05 0602) SpO2:  [90 %-95 %] 90 % (02/05 0602)  Intake/Output from previous day: 02/04 0701 - 02/05 0700 In: 1437.5 [P.O.:900; I.V.:537.5] Out: 1900 [Urine:1600; Stool:300] Intake/Output this shift: No intake/output data recorded.  Physical Exam:  General: Alert and oriented CV: RRR Lungs: Normal work of breathing Abdomen: Soft, very tender at extraction site [id number] volitionally Ext: NT, No erythema  Lab Results: Recent Labs    10/04/19 1048 10/05/19 0526  HGB 13.5 12.6  HCT 41.3 38.0   BMET Recent Labs    10/05/19 0526  NA 136  K 4.1  CL 101  CO2 27  GLUCOSE 118*  BUN 22*  CREATININE 1.57*  CALCIUM 9.2     Studies/Results: No results found.  Assessment/Plan:  59 y.o. female s/p robotic left nephrectomy. Labs and vitals are WNL but need to stay for further pain control  -Scheduled Tylenol, lidocaine patches, ice packs -Discontinue oxycodone -Dilaudid 2 mg every 4 as needed -Flexeril 5 mg 3 times daily -We will try to wean IV medication and only uses a last resort. -Once patient is on a stable oral pain regimen she can go home   Dispo: Floor   LOS: 2 days   Tharon Aquas 10/06/2019, 7:23 AM

## 2019-10-06 NOTE — Progress Notes (Signed)
Pt discharged home today per Dr. Tresa Moore. Pt's IV sites D/C'd and WDL. Pt's VSS. Pt provided with home medication list, discharge instructions and prescriptions. Verbalized understanding. Pt left floor via WC in stable condition accompanied by nursing students.

## 2019-10-06 NOTE — Plan of Care (Signed)
  Problem: Clinical Measurements: Goal: Ability to maintain clinical measurements within normal limits will improve 10/06/2019 1712 by Annie Sable, RN Outcome: Completed/Met 10/06/2019 0951 by Annie Sable, RN Outcome: Progressing Goal: Will remain free from infection 10/06/2019 1712 by Annie Sable, RN Outcome: Completed/Met 10/06/2019 0951 by Annie Sable, RN Outcome: Progressing Goal: Diagnostic test results will improve 10/06/2019 1712 by Annie Sable, RN Outcome: Completed/Met 10/06/2019 0951 by Annie Sable, RN Outcome: Progressing Goal: Respiratory complications will improve 10/06/2019 1712 by Annie Sable, RN Outcome: Completed/Met 10/06/2019 0951 by Annie Sable, RN Outcome: Progressing Goal: Cardiovascular complication will be avoided Outcome: Completed/Met   Problem: Activity: Goal: Risk for activity intolerance will decrease 10/06/2019 1712 by Annie Sable, RN Outcome: Completed/Met 10/06/2019 0951 by Annie Sable, RN Outcome: Progressing   Problem: Nutrition: Goal: Adequate nutrition will be maintained 10/06/2019 1712 by Annie Sable, RN Outcome: Completed/Met 10/06/2019 0951 by Annie Sable, RN Outcome: Progressing   Problem: Coping: Goal: Level of anxiety will decrease 10/06/2019 1712 by Annie Sable, RN Outcome: Completed/Met 10/06/2019 0951 by Annie Sable, RN Outcome: Progressing   Problem: Elimination: Goal: Will not experience complications related to bowel motility 10/06/2019 1712 by Annie Sable, RN Outcome: Completed/Met 10/06/2019 0951 by Annie Sable, RN Outcome: Progressing Goal: Will not experience complications related to urinary retention Outcome: Completed/Met   Problem: Pain Managment: Goal: General experience of comfort will improve 10/06/2019 1712 by Annie Sable, RN Outcome: Completed/Met 10/06/2019 0951 by Annie Sable, RN Outcome: Progressing   Problem: Safety: Goal: Ability to remain free from injury  will improve 10/06/2019 1712 by Annie Sable, RN Outcome: Completed/Met 10/06/2019 0951 by Annie Sable, RN Outcome: Progressing   Problem: Skin Integrity: Goal: Risk for impaired skin integrity will decrease 10/06/2019 1712 by Annie Sable, RN Outcome: Completed/Met 10/06/2019 0951 by Annie Sable, RN Outcome: Progressing   Problem: Education: Goal: Knowledge of the prescribed therapeutic regimen will improve 10/06/2019 1712 by Annie Sable, RN Outcome: Completed/Met 10/06/2019 0951 by Annie Sable, RN Outcome: Progressing   Problem: Bowel/Gastric: Goal: Gastrointestinal status for postoperative course will improve 10/06/2019 1712 by Annie Sable, RN Outcome: Completed/Met 10/06/2019 0951 by Annie Sable, RN Outcome: Progressing   Problem: Clinical Measurements: Goal: Postoperative complications will be avoided or minimized 10/06/2019 1712 by Annie Sable, RN Outcome: Completed/Met 10/06/2019 0951 by Annie Sable, RN Outcome: Progressing   Problem: Respiratory: Goal: Ability to achieve and maintain a regular respiratory rate will improve 10/06/2019 1712 by Annie Sable, RN Outcome: Completed/Met 10/06/2019 0951 by Annie Sable, RN Outcome: Progressing   Problem: Skin Integrity: Goal: Demonstration of wound healing without infection will improve 10/06/2019 1712 by Annie Sable, RN Outcome: Completed/Met 10/06/2019 0951 by Annie Sable, RN Outcome: Progressing   Problem: Urinary Elimination: Goal: Ability to avoid or minimize complications of infection will improve 10/06/2019 1712 by Annie Sable, RN Outcome: Completed/Met 10/06/2019 0951 by Annie Sable, RN Outcome: Progressing Goal: Ability to achieve and maintain urine output will improve Outcome: Completed/Met

## 2019-10-06 NOTE — Discharge Instructions (Addendum)

## 2019-10-06 NOTE — Plan of Care (Signed)
  Problem: Clinical Measurements: Goal: Ability to maintain clinical measurements within normal limits will improve Outcome: Progressing Goal: Will remain free from infection Outcome: Progressing Goal: Diagnostic test results will improve Outcome: Progressing Goal: Respiratory complications will improve Outcome: Progressing Goal: Cardiovascular complication will be avoided Outcome: Completed/Met   Problem: Activity: Goal: Risk for activity intolerance will decrease Outcome: Progressing   Problem: Nutrition: Goal: Adequate nutrition will be maintained Outcome: Progressing   Problem: Coping: Goal: Level of anxiety will decrease Outcome: Progressing   Problem: Elimination: Goal: Will not experience complications related to bowel motility Outcome: Progressing Goal: Will not experience complications related to urinary retention Outcome: Completed/Met   Problem: Pain Managment: Goal: General experience of comfort will improve Outcome: Progressing   Problem: Safety: Goal: Ability to remain free from injury will improve Outcome: Progressing   Problem: Skin Integrity: Goal: Risk for impaired skin integrity will decrease Outcome: Progressing   Problem: Education: Goal: Knowledge of the prescribed therapeutic regimen will improve Outcome: Progressing   Problem: Bowel/Gastric: Goal: Gastrointestinal status for postoperative course will improve Outcome: Progressing   Problem: Clinical Measurements: Goal: Postoperative complications will be avoided or minimized Outcome: Progressing   Problem: Respiratory: Goal: Ability to achieve and maintain a regular respiratory rate will improve Outcome: Progressing   Problem: Skin Integrity: Goal: Demonstration of wound healing without infection will improve Outcome: Progressing   Problem: Urinary Elimination: Goal: Ability to avoid or minimize complications of infection will improve Outcome: Progressing Goal: Ability to achieve  and maintain urine output will improve Outcome: Completed/Met

## 2019-10-12 ENCOUNTER — Telehealth: Payer: Self-pay | Admitting: Oncology

## 2019-10-12 NOTE — Telephone Encounter (Signed)
FAXED OFFICE NOTE TO [facility]

## 2019-10-16 ENCOUNTER — Ambulatory Visit
Admission: RE | Admit: 2019-10-16 | Discharge: 2019-10-16 | Disposition: A | Discharge status: Home / Self Care | Payer: 59 | Source: Ambulatory Visit | Attending: Oncology | Admitting: Oncology

## 2019-10-16 ENCOUNTER — Telehealth: Payer: Self-pay

## 2019-10-16 DIAGNOSIS — C649 Malignant neoplasm of unspecified kidney, except renal pelvis: Secondary | ICD-10-CM

## 2019-10-16 MED ORDER — GADOBENATE DIMEGLUMINE 529 MG/ML IV SOLN
7.0000 mL | Freq: Once | INTRAVENOUS | Status: AC | PRN
  Administered 2019-10-16: 7 mL via INTRAVENOUS

## 2019-10-16 NOTE — Telephone Encounter (Signed)
Spoke with patient regarding liver lesion on CT.  Lesion is a benign cyst.  Patient to follow up next week.

## 2019-10-17 DIAGNOSIS — C649 Malignant neoplasm of unspecified kidney, except renal pelvis: Secondary | ICD-10-CM | POA: Insufficient documentation

## 2019-10-23 ENCOUNTER — Inpatient Hospital Stay: Payer: 59 | Attending: Oncology | Admitting: Oncology

## 2019-10-23 ENCOUNTER — Other Ambulatory Visit: Payer: Self-pay

## 2019-10-23 ENCOUNTER — Other Ambulatory Visit: Payer: 59

## 2019-10-23 VITALS — BP 115/76 | HR 88 | Temp 98.3°F | Resp 20 | Ht 64.0 in | Wt 148.2 lb

## 2019-10-23 DIAGNOSIS — R5381 Other malaise: Secondary | ICD-10-CM | POA: Diagnosis not present

## 2019-10-23 DIAGNOSIS — M459 Ankylosing spondylitis of unspecified sites in spine: Secondary | ICD-10-CM | POA: Insufficient documentation

## 2019-10-23 DIAGNOSIS — C649 Malignant neoplasm of unspecified kidney, except renal pelvis: Secondary | ICD-10-CM

## 2019-10-23 DIAGNOSIS — C642 Malignant neoplasm of left kidney, except renal pelvis: Secondary | ICD-10-CM | POA: Diagnosis present

## 2019-10-23 DIAGNOSIS — Z905 Acquired absence of kidney: Secondary | ICD-10-CM | POA: Diagnosis not present

## 2019-10-23 NOTE — Progress Notes (Signed)
  [facility] OFFICE PROGRESS NOTE   Diagnosis: Renal cell carcinoma  INTERVAL HISTORY:   Ms. Sherry Fernandez underwent a robotic assisted laparoscopic left radical nephrectomy by Dr. Tresa Moore on 10/04/2019.  She is recovering from surgery.  She has malaise and otherwise feels well. 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  No tumor necrosis.  Tumor extended into the segmental renal vein and resection margins are negative. She underwent a liver MRI on 10/16/2019 to follow-up on the indeterminate lesion seen on CT.  The hypodense lesion seen on CT is a benign cyst.  No liver lesions.  There was persistent intraperitoneal gas Objective:  Vital signs in last 24 hours:  Blood pressure 115/76, pulse 88, temperature 98.3 F (36.8 C), temperature source Temporal, resp. rate 20, height 5\' 4"  (1.626 m), weight 148 lb 3.2 oz (67.2 kg), SpO2 99 %.     GI: Healed surgical incisions, no hepatosplenomegaly, no mass, soft Vascular: No leg edema   Lab Results:  Lab Results  Component Value Date   WBC 6.3 09/27/2019   HGB 12.6 10/05/2019   HCT 38.0 10/05/2019   MCV 87.4 09/27/2019   PLT 216 09/27/2019   NEUTROABS 3.9 06/23/2007    CMP  Lab Results  Component Value Date   NA 136 10/05/2019   K 4.1 10/05/2019   CL 101 10/05/2019   CO2 27 10/05/2019   GLUCOSE 118 (H) 10/05/2019   BUN 22 (H) 10/05/2019   CREATININE 1.57 (H) 10/05/2019   CALCIUM 9.2 10/05/2019   PROT 6.8 06/23/2007   ALBUMIN 4.4 06/23/2007   AST 25 06/23/2007   ALT 25 06/23/2007   ALKPHOS 58 06/23/2007   BILITOT 0.7 06/23/2007   GFRNONAA 35 (L) 10/05/2019   GFRAA 41 (L) 10/05/2019    Medications: I have reviewed the patient's current medications.   Assessment/Plan:  1. Renal cell carcinoma  Left renal mass, benign-appearing right middle lobe subpleural nodule noted on a chest CT 09/12/2019  Robotic assisted laparoscopic left radical nephrectomy 10/04/2019-stage  III (T3a, NX) clear cell carcinoma measuring 4.6 cm with extension to the segmental renal vein, negative resection margins, grade 2 2. Indeterminate liver lesion noted on chest CT 09/12/2019  MRI 10/16/2019-liver lesion of concern on CT is a benign cyst, no concerning liver lesions  3. History of ankylosing spondylitis   Disposition: Ms. Flis has been diagnosed with stage III renal cell carcinoma.  She is recovering from a left nephrectomy procedure.  There is no indication for adjuvant systemic therapy.  It appears the tumor was found incidentally.  She has a good prognosis for long-term survival.  She plans to follow-up with Dr. Tresa Moore for surveillance imaging and clinical follow-up.  She is not scheduled for a follow-up appointment at the Cancer center.  I am available to see her in the future as needed. Betsy Coder, MD  10/23/2019  4:37 PM

## 2019-10-25 ENCOUNTER — Telehealth: Payer: Self-pay | Admitting: *Deleted

## 2019-10-25 NOTE — Telephone Encounter (Signed)
Informed her Dr. Benay Spice reviewed her path report with current staging system she is still Stage III and requires no treatment now.

## 2019-10-26 ENCOUNTER — Other Ambulatory Visit: Payer: Self-pay | Admitting: Surgery

## 2019-10-26 DIAGNOSIS — Q441 Other congenital malformations of gallbladder: Secondary | ICD-10-CM

## 2019-10-27 ENCOUNTER — Ambulatory Visit
Admission: RE | Admit: 2019-10-27 | Discharge: 2019-10-27 | Disposition: A | Discharge status: Home / Self Care | Payer: 59 | Source: Ambulatory Visit | Attending: Surgery | Admitting: Surgery

## 2019-10-27 ENCOUNTER — Other Ambulatory Visit: Payer: Self-pay | Admitting: Obstetrics and Gynecology

## 2019-10-27 DIAGNOSIS — Q441 Other congenital malformations of gallbladder: Secondary | ICD-10-CM

## 2019-10-27 DIAGNOSIS — Z1231 Encounter for screening mammogram for malignant neoplasm of breast: Secondary | ICD-10-CM

## 2019-10-30 ENCOUNTER — Other Ambulatory Visit: Payer: Self-pay

## 2019-10-30 ENCOUNTER — Ambulatory Visit
Admission: RE | Admit: 2019-10-30 | Discharge: 2019-10-30 | Disposition: A | Discharge status: Home / Self Care | Payer: 59 | Source: Ambulatory Visit | Attending: Obstetrics and Gynecology | Admitting: Obstetrics and Gynecology

## 2019-10-30 DIAGNOSIS — Z1231 Encounter for screening mammogram for malignant neoplasm of breast: Secondary | ICD-10-CM

## 2019-11-02 ENCOUNTER — Ambulatory Visit: Payer: Self-pay | Admitting: Surgery

## 2019-11-07 ENCOUNTER — Encounter: Payer: Self-pay | Admitting: Surgery

## 2019-11-07 DIAGNOSIS — R932 Abnormal findings on diagnostic imaging of liver and biliary tract: Secondary | ICD-10-CM | POA: Diagnosis present

## 2019-11-07 NOTE — H&P (Signed)
General Surgery [facility]  Sherry Fernandez DOB: 1961-04-20 Married / Language: English / Race: White Female   History of Present Illness   The patient is a 59 year old female who presents for evaluation of gall stones.  CHIEF COMPLAINT: possible gallstones vs gallbladder polyps  Patient is referred by Dr. Julieanne Manson for surgical evaluation and management of possible gallstones or gallbladder polyps. Patient's primary care physician is Dr. Deland Pretty. Patient had experienced sudden onset of chest pressure and upper abdominal discomfort in September 2020. She contacted her primary care physician and was referred to gastroenterology. She was evaluated at [facility]. She underwent upper endoscopy and colonoscopy, both of which were normal studies. She also underwent an abdominal ultrasound which showed questionable gallstones versus a possible gallbladder polyp. Also noted however was a 4 cm mass in her left kidney. Patient subsequently underwent evaluation and one week ago underwent laparoscopic assisted left nephrectomy by Dr. Phebe Colla for renal cell carcinoma. She has seen Dr. Julieanne Manson at the cancer center in consultation. She is scheduled for an MRI scan of the liver on Monday to evaluate a potential liver lesion. Patient is now referred for consideration for cholecystectomy based on the original ultrasound study. Patient has had normal liver enzymes. She denies any history of pancreatitis or hepatitis. There is a family history of gallbladder disease and the patient's mother. She presents today accompanied by her husband for further evaluation and recommendations. We reviewed the report from [facility] in detail. The ultrasound says that there is a question of polyp or gallstones without gallbladder wall thickening or pericholecystic fluid. There is no ductal dilatation. No measurements were taken on any structures within the  gallbladder.   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  Diagnostic Studies History  Colonoscopy  5-10 years ago Mammogram  1-3 years ago Pap Smear  1-5 years ago  Allergies  Sulfa Antibiotics   Medication History Dexilant (60MG  Capsule DR, Oral) Active. Medications Reconciled  Family History Arthritis  Father. Colon Polyps  Mother. Heart Disease  Father. Heart disease in female family member before age 54  Heart disease in female family member before age 66  Hypertension  Father, Mother. Ischemic Bowel Disease  Mother. Kidney Disease  Mother.  Pregnancy / Birth History Age at menarche  31 years. Gravida  3 Length (months) of breastfeeding  7-12 Maternal age  60-25 Para  3  Other Problems Arthritis  Gastroesophageal Reflux Disease  Hypercholesterolemia  Inguinal Hernia  Migraine Headache   Vitals  Weight: 152 lb Height: 64in Body Surface Area: 1.74 m Body Mass Index: 26.09 kg/m  Temp.: 98.24F(Tympanic)  Pulse: 110 (Regular)  BP: 136/72 (Sitting, Left Arm, Standard)  Physical Exam   GENERAL APPEARANCE Development: normal Nutritional status: normal Gross deformities: none  SKIN Rash, lesions, ulcers: none Induration, erythema: none Nodules: none palpable  EYES Conjunctiva and lids: normal Pupils: equal and reactive Iris: normal bilaterally  EARS, NOSE, MOUTH, THROAT External ears: no lesion or deformity External nose: no lesion or deformity Hearing: grossly normal Due to Covid-19 pandemic, patient is wearing a mask.  NECK Symmetric: yes Trachea: midline Thyroid: no palpable nodules in the thyroid bed  CHEST Respiratory effort: normal Retraction or accessory muscle use: no Breath sounds: normal bilaterally Rales, rhonchi, wheeze:  none  CARDIOVASCULAR Auscultation: regular rhythm, normal  rate Murmurs: none Pulses: radial pulse 2+ palpable Lower extremity edema: none  ABDOMEN Distension: none Masses: none palpable Tenderness: none Hepatosplenomegaly: not present Hernia: not present Healing surgical incisions consistent with laparoscopic assisted left nephrectomy. No sign of infection.  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 Patient is provided with written literature on gallbladder surgery. She is accompanied today by her husband.  We reviewed a rather complex recent clinical course for this patient dating back to September 2020. We reviewed the actual gallbladder ultrasound report. Patient may have a bladder polyps or gallstones although these were not clearly evident on the ultrasound and did not appear to be clearly evident on the outside CT scan images which I reviewed today.  Patient is scheduled for an MRI scan on Monday. This is to evaluate a liver lesion to rule out metastatic disease from her renal cell carcinoma. I would like to wait for that study to be completed and reviewed the images to see if there is any sign of abnormality with the gallbladder. After reviewing the MRI scan, I will make a decision whether the patient requires repeat ultrasound  examination at [facility] imaging or possibly a nuclear medicine hepatobiliary scan to evaluate the gallbladder function. Following review of all of these studies, we will make a final decision regarding the need for cholecystectomy.  Patient and her husband understand my concerns with the limited amount of information on the gallbladder that we have at the present time. We will await the MRI scan results and then proceed accordingly.  ADDENDUM  MRI reviewed. USN of gallbladder repeated at [place] with abnormal results.  Plan to proceed with lap chole with IOC.  The risks and benefits of the procedure have been discussed at length with the patient.  The patient understands the proposed procedure, potential alternative treatments, and the course of recovery to be expected.  All of the patient's questions have been answered at this time.  The patient wishes to proceed with surgery.  Armandina Gemma, MD [facility] Office: [phone]

## 2019-11-09 NOTE — Patient Instructions (Signed)
DUE TO COVID-19 ONLY ONE VISITOR IS ALLOWED TO COME WITH YOU AND STAY IN THE WAITING ROOM ONLY DURING PRE OP AND PROCEDURE DAY OF SURGERY. THE 1 VISITOR MAY VISIT WITH YOU AFTER SURGERY IN YOUR PRIVATE ROOM DURING VISITING HOURS ONLY!  YOU NEED TO HAVE A COVID 19 TEST ON_3/19______ @__8 :55_____, THIS TEST MUST BE DONE BEFORE SURGERY, COME  [name] , [id number].  ([place]) ONCE YOUR COVID TEST IS COMPLETED, PLEASE BEGIN THE QUARANTINE INSTRUCTIONS AS OUTLINED IN YOUR HANDOUT.                Sherry Fernandez   Your procedure is scheduled on: 11/20/19   Report to [facility] Main  Entrance   Report to admitting at  9:00 AM     Call this number if you have problems the morning of surgery [phone]    Remember: Do not eat food after Midnight.  You may have clear liquids until 8:00 AM  CLEAR LIQUID DIET   Foods Allowed                                                                     Foods Excluded  Coffee and tea, regular and decaf                             liquids that you cannot  Plain Jell-O any favor except red or purple                                           see through such as: Fruit ices (not with fruit pulp)                                     milk, soups, orange juice  Iced Popsicles                                    All solid food Carbonated beverages, regular and diet                                    Cranberry, grape and apple juices Sports drinks like Gatorade Lightly seasoned clear broth or consume(fat free) Sugar, honey syrup  _____________________________________________________________________     BRUSH YOUR TEETH MORNING OF SURGERY AND RINSE YOUR MOUTH OUT, NO CHEWING GUM CANDY OR MINTS.     Take these medicines the morning of surgery with A SIP OF WATER: Dexilant                                 You may not have any metal on your body including hair pins and              piercings  Do not wear  jewelry, make-up, lotions, powders or perfumes,  deodorant             Do not wear nail polish on your fingernails.  Do not shave  48 hours prior to surgery.          Do not bring valuables to the hospital. [name].  Contacts, dentures or bridgework may not be worn into surgery.       Patients discharged the day of surgery will not be allowed to drive home  . IF YOU ARE HAVING SURGERY AND GOING HOME THE SAME DAY, YOU MUST HAVE AN ADULT TO DRIVE YOU HOME AND BE WITH YOU FOR 24 HOURS.   YOU MAY GO HOME BY TAXI OR UBER OR ORTHERWISE, BUT AN ADULT MUST ACCOMPANY YOU HOME AND STAY WITH YOU FOR 24 HOURS.  Name and phone number of your driver:  Special Instructions: N/A              Please read over the following fact sheets you were given: _____________________________________________________________________             [facility] - Preparing for Surgery  Before surgery, you can play an important role.   Because skin is not sterile, your skin needs to be as free of germs as possible.   You can reduce the number of germs on your skin by washing with CHG (chlorahexidine gluconate) soap before surgery.   CHG is an antiseptic cleaner which kills germs and bonds with the skin to continue killing germs even after washing. Please DO NOT use if you have an allergy to CHG or antibacterial soaps.   If your skin becomes reddened/irritated stop using the CHG and inform your nurse when you arrive at Short Stay. Do not shave (including legs and underarms) for at least 48 hours prior to the first CHG shower.   Please follow these instructions carefully:  1.  Shower with CHG Soap the night before surgery and the  morning of Surgery.  2.  If you choose to wash your hair, wash your hair first as usual with your  normal  shampoo.  3.  After you shampoo, rinse your hair and body thoroughly to remove the  shampoo.                                          4.  Use CHG as you would any other liquid soap.  You can apply chg directly  to the skin and wash                       Gently with a scrungie or clean washcloth.  5.  Apply the CHG Soap to your body ONLY FROM THE NECK DOWN.   Do not use on face/ open                           Wound or open sores. Avoid contact with eyes, ears mouth and genitals (private parts).                       Wash face,  Genitals (private parts) with your normal soap.             6.  Wash thoroughly, paying special attention  to the area where your surgery  will be performed.  7.  Thoroughly rinse your body with warm water from the neck down.  8.  DO NOT shower/wash with your normal soap after using and rinsing off  the CHG Soap.              9.  Pat yourself dry with a clean towel.            10.  Wear clean pajamas.            11.  Place clean sheets on your bed the night of your first shower and do not  sleep with pets. Day of Surgery : Do not apply any lotions/deodorants the morning of surgery.  Please wear clean clothes to the hospital/surgery center.  FAILURE TO FOLLOW THESE INSTRUCTIONS MAY RESULT IN THE CANCELLATION OF YOUR SURGERY PATIENT SIGNATURE_________________________________  NURSE SIGNATURE__________________________________  ________________________________________________________________________

## 2019-11-10 ENCOUNTER — Other Ambulatory Visit: Payer: Self-pay

## 2019-11-10 ENCOUNTER — Encounter (HOSPITAL_COMMUNITY): Payer: Self-pay

## 2019-11-10 ENCOUNTER — Encounter (HOSPITAL_COMMUNITY)
Admission: RE | Admit: 2019-11-10 | Discharge: 2019-11-10 | Disposition: A | Discharge status: Home / Self Care | Payer: 59 | Source: Ambulatory Visit | Attending: Surgery | Admitting: Surgery

## 2019-11-10 DIAGNOSIS — Z01812 Encounter for preprocedural laboratory examination: Secondary | ICD-10-CM | POA: Diagnosis present

## 2019-11-10 NOTE — Progress Notes (Signed)
PCP - Dr. Audie Pinto Cardiologist - Dr. Jerilynn Mages. Facc  Chest x-ray - no EKG - 09/27/19 Stress Test - no ECHO - 2018 Cardiac Cath - no  Sleep Study - no CPAP -   Fasting Blood Sugar - NA Checks Blood Sugar _____ times a day  Blood Thinner Instructions:NA Aspirin Instructions: Last Dose:  Anesthesia review:   Patient denies shortness of breath, fever, cough and chest pain at PAT appointment yes  Patient verbalized understanding of instructions that were given to them at the PAT appointment. Patient was also instructed that they will need to review over the PAT instructions again at home before surgery. Yes  Pt is S/P Nephrectomy 10/04/19

## 2019-11-13 ENCOUNTER — Other Ambulatory Visit: Payer: Self-pay

## 2019-11-13 ENCOUNTER — Encounter (HOSPITAL_COMMUNITY)
Admission: RE | Admit: 2019-11-13 | Discharge: 2019-11-13 | Disposition: A | Discharge status: Home / Self Care | Payer: 59 | Source: Ambulatory Visit | Attending: Surgery | Admitting: Surgery

## 2019-11-13 DIAGNOSIS — Z01812 Encounter for preprocedural laboratory examination: Secondary | ICD-10-CM | POA: Diagnosis not present

## 2019-11-13 LAB — CBC
HCT: 40.9 % (ref 36.0–46.0)
Hemoglobin: 13.2 g/dL (ref 12.0–15.0)
MCH: 28.2 pg (ref 26.0–34.0)
MCHC: 32.3 g/dL (ref 30.0–36.0)
MCV: 87.4 fL (ref 80.0–100.0)
Platelets: 207 10*3/uL (ref 150–400)
RBC: 4.68 MIL/uL (ref 3.87–5.11)
RDW: 12.9 % (ref 11.5–15.5)
WBC: 5.3 10*3/uL (ref 4.0–10.5)
nRBC: 0 % (ref 0.0–0.2)

## 2019-11-13 LAB — BASIC METABOLIC PANEL
Anion gap: 10 (ref 5–15)
BUN: 23 mg/dL — ABNORMAL HIGH (ref 6–20)
CO2: 25 mmol/L (ref 22–32)
Calcium: 9.4 mg/dL (ref 8.9–10.3)
Chloride: 102 mmol/L (ref 98–111)
Creatinine, Ser: 1.17 mg/dL — ABNORMAL HIGH (ref 0.44–1.00)
GFR calc Af Amer: 59 mL/min — ABNORMAL LOW (ref >60)
GFR calc non Af Amer: 51 mL/min — ABNORMAL LOW (ref >60)
Glucose, Bld: 107 mg/dL — ABNORMAL HIGH (ref 70–99)
Potassium: 3.8 mmol/L (ref 3.5–5.1)
Sodium: 137 mmol/L (ref 135–145)

## 2019-11-13 LAB — HEMOGLOBIN A1C
Hgb A1c MFr Bld: 6 % — ABNORMAL HIGH (ref 4.8–5.6)
Mean Plasma Glucose: 125.5 mg/dL

## 2019-11-17 ENCOUNTER — Other Ambulatory Visit (HOSPITAL_COMMUNITY)
Admission: RE | Admit: 2019-11-17 | Discharge: 2019-11-17 | Disposition: A | Discharge status: Home / Self Care | Payer: 59 | Source: Ambulatory Visit | Attending: Surgery | Admitting: Surgery

## 2019-11-17 DIAGNOSIS — Z20822 Contact with and (suspected) exposure to covid-19: Secondary | ICD-10-CM | POA: Insufficient documentation

## 2019-11-17 DIAGNOSIS — Z01812 Encounter for preprocedural laboratory examination: Secondary | ICD-10-CM | POA: Diagnosis present

## 2019-11-17 LAB — SARS CORONAVIRUS 2 (TAT 6-24 HRS): SARS Coronavirus 2: NEGATIVE

## 2019-11-20 ENCOUNTER — Ambulatory Visit (HOSPITAL_COMMUNITY)
Admission: RE | Admit: 2019-11-20 | Discharge: 2019-11-21 | Disposition: A | Discharge status: Home / Self Care | Payer: 59 | Attending: Surgery | Admitting: Surgery

## 2019-11-20 ENCOUNTER — Ambulatory Visit (HOSPITAL_COMMUNITY): Payer: 59

## 2019-11-20 ENCOUNTER — Ambulatory Visit (HOSPITAL_COMMUNITY): Payer: 59 | Admitting: Anesthesiology

## 2019-11-20 ENCOUNTER — Encounter (HOSPITAL_COMMUNITY): Payer: Self-pay | Admitting: Surgery

## 2019-11-20 ENCOUNTER — Other Ambulatory Visit: Payer: Self-pay

## 2019-11-20 ENCOUNTER — Encounter (HOSPITAL_COMMUNITY)
Admission: RE | Disposition: A | Discharge status: Home / Self Care | Payer: Self-pay | Source: Home / Self Care | Attending: Surgery

## 2019-11-20 ENCOUNTER — Ambulatory Visit (HOSPITAL_COMMUNITY): Payer: 59 | Admitting: Physician Assistant

## 2019-11-20 DIAGNOSIS — M1990 Unspecified osteoarthritis, unspecified site: Secondary | ICD-10-CM | POA: Insufficient documentation

## 2019-11-20 DIAGNOSIS — G43909 Migraine, unspecified, not intractable, without status migrainosus: Secondary | ICD-10-CM | POA: Diagnosis not present

## 2019-11-20 DIAGNOSIS — C642 Malignant neoplasm of left kidney, except renal pelvis: Secondary | ICD-10-CM | POA: Insufficient documentation

## 2019-11-20 DIAGNOSIS — Z79899 Other long term (current) drug therapy: Secondary | ICD-10-CM | POA: Insufficient documentation

## 2019-11-20 DIAGNOSIS — K828 Other specified diseases of gallbladder: Secondary | ICD-10-CM | POA: Diagnosis present

## 2019-11-20 DIAGNOSIS — K811 Chronic cholecystitis: Secondary | ICD-10-CM | POA: Diagnosis not present

## 2019-11-20 DIAGNOSIS — Z8261 Family history of arthritis: Secondary | ICD-10-CM | POA: Diagnosis not present

## 2019-11-20 DIAGNOSIS — Z882 Allergy status to sulfonamides status: Secondary | ICD-10-CM | POA: Diagnosis not present

## 2019-11-20 DIAGNOSIS — Z905 Acquired absence of kidney: Secondary | ICD-10-CM | POA: Insufficient documentation

## 2019-11-20 DIAGNOSIS — I10 Essential (primary) hypertension: Secondary | ICD-10-CM | POA: Insufficient documentation

## 2019-11-20 DIAGNOSIS — Z8249 Family history of ischemic heart disease and other diseases of the circulatory system: Secondary | ICD-10-CM | POA: Insufficient documentation

## 2019-11-20 DIAGNOSIS — K219 Gastro-esophageal reflux disease without esophagitis: Secondary | ICD-10-CM | POA: Insufficient documentation

## 2019-11-20 DIAGNOSIS — Z419 Encounter for procedure for purposes other than remedying health state, unspecified: Secondary | ICD-10-CM

## 2019-11-20 DIAGNOSIS — R932 Abnormal findings on diagnostic imaging of liver and biliary tract: Secondary | ICD-10-CM | POA: Diagnosis present

## 2019-11-20 HISTORY — PX: CHOLECYSTECTOMY: SHX55

## 2019-11-20 LAB — GLUCOSE, CAPILLARY: Glucose-Capillary: 96 mg/dL (ref 70–99)

## 2019-11-20 SURGERY — LAPAROSCOPIC CHOLECYSTECTOMY WITH INTRAOPERATIVE CHOLANGIOGRAM
Anesthesia: General | Site: Abdomen

## 2019-11-20 MED ORDER — OXYCODONE HCL 5 MG PO TABS
5.0000 mg | ORAL_TABLET | ORAL | Status: DC | PRN
  Administered 2019-11-21: 5 mg via ORAL
  Filled 2019-11-20: qty 1

## 2019-11-20 MED ORDER — LIDOCAINE 2% (20 MG/ML) 5 ML SYRINGE
INTRAMUSCULAR | Status: AC
  Filled 2019-11-20: qty 5

## 2019-11-20 MED ORDER — BUPIVACAINE-EPINEPHRINE (PF) 0.5% -1:200000 IJ SOLN
INTRAMUSCULAR | Status: AC
  Filled 2019-11-20: qty 1.8

## 2019-11-20 MED ORDER — BUPIVACAINE-EPINEPHRINE (PF) 0.5% -1:200000 IJ SOLN
INTRAMUSCULAR | Status: AC
  Filled 2019-11-20: qty 30

## 2019-11-20 MED ORDER — PROPOFOL 10 MG/ML IV BOLUS
INTRAVENOUS | Status: AC
  Filled 2019-11-20: qty 20

## 2019-11-20 MED ORDER — TRAMADOL HCL 50 MG PO TABS: 50.0000 mg | ORAL_TABLET | Freq: Four times a day (QID) | ORAL | Status: DC | PRN

## 2019-11-20 MED ORDER — HYDROMORPHONE HCL 1 MG/ML IJ SOLN
0.2500 mg | INTRAMUSCULAR | Status: DC | PRN
  Administered 2019-11-20: 0.5 mg via INTRAVENOUS

## 2019-11-20 MED ORDER — FENTANYL CITRATE (PF) 100 MCG/2ML IJ SOLN
INTRAMUSCULAR | Status: DC | PRN
  Administered 2019-11-20 (×2): 50 ug via INTRAVENOUS
  Administered 2019-11-20: 100 ug via INTRAVENOUS
  Administered 2019-11-20: 50 ug via INTRAVENOUS

## 2019-11-20 MED ORDER — CHLORHEXIDINE GLUCONATE CLOTH 2 % EX PADS: 6.0000 | MEDICATED_PAD | Freq: Once | CUTANEOUS | Status: DC

## 2019-11-20 MED ORDER — CEFAZOLIN SODIUM-DEXTROSE 2-4 GM/100ML-% IV SOLN
2.0000 g | INTRAVENOUS | Status: AC
  Administered 2019-11-20: 2 g via INTRAVENOUS
  Filled 2019-11-20: qty 100

## 2019-11-20 MED ORDER — ACETAMINOPHEN 325 MG PO TABS: 650.0000 mg | ORAL_TABLET | Freq: Four times a day (QID) | ORAL | Status: DC | PRN

## 2019-11-20 MED ORDER — PHENYLEPHRINE HCL (PRESSORS) 10 MG/ML IV SOLN
INTRAVENOUS | Status: DC | PRN
  Administered 2019-11-20: 80 ug via INTRAVENOUS
  Administered 2019-11-20: 40 ug via INTRAVENOUS
  Administered 2019-11-20: 120 ug via INTRAVENOUS
  Administered 2019-11-20: 40 ug via INTRAVENOUS

## 2019-11-20 MED ORDER — HYDROMORPHONE HCL 1 MG/ML IJ SOLN
1.0000 mg | INTRAMUSCULAR | Status: DC | PRN
  Administered 2019-11-20 – 2019-11-21 (×4): 1 mg via INTRAVENOUS
  Filled 2019-11-20 (×4): qty 1

## 2019-11-20 MED ORDER — SUGAMMADEX SODIUM 200 MG/2ML IV SOLN
INTRAVENOUS | Status: DC | PRN
  Administered 2019-11-20: 200 mg via INTRAVENOUS

## 2019-11-20 MED ORDER — ROCURONIUM BROMIDE 10 MG/ML (PF) SYRINGE
PREFILLED_SYRINGE | INTRAVENOUS | Status: AC
  Filled 2019-11-20: qty 10

## 2019-11-20 MED ORDER — MEPERIDINE HCL 50 MG/ML IJ SOLN: 6.2500 mg | INTRAMUSCULAR | Status: DC | PRN

## 2019-11-20 MED ORDER — ESMOLOL HCL 100 MG/10ML IV SOLN
INTRAVENOUS | Status: DC | PRN
  Administered 2019-11-20: 15 mg via INTRAVENOUS

## 2019-11-20 MED ORDER — HYDROMORPHONE HCL 1 MG/ML IJ SOLN
INTRAMUSCULAR | Status: AC
  Administered 2019-11-20: 0.5 mg via INTRAVENOUS
  Filled 2019-11-20: qty 1

## 2019-11-20 MED ORDER — OXYCODONE HCL 5 MG/5ML PO SOLN: 5.0000 mg | Freq: Once | ORAL | Status: DC | PRN

## 2019-11-20 MED ORDER — DEXMEDETOMIDINE HCL IN NACL 200 MCG/50ML IV SOLN
INTRAVENOUS | Status: AC
  Filled 2019-11-20: qty 50

## 2019-11-20 MED ORDER — GLYCOPYRROLATE 0.2 MG/ML IJ SOLN
INTRAMUSCULAR | Status: DC | PRN
  Administered 2019-11-20: .2 mg via INTRAVENOUS

## 2019-11-20 MED ORDER — PROPOFOL 10 MG/ML IV BOLUS
INTRAVENOUS | Status: DC | PRN
  Administered 2019-11-20: 150 mg via INTRAVENOUS

## 2019-11-20 MED ORDER — ACETAMINOPHEN 650 MG RE SUPP: 650.0000 mg | Freq: Four times a day (QID) | RECTAL | Status: DC | PRN

## 2019-11-20 MED ORDER — KCL IN DEXTROSE-NACL 20-5-0.45 MEQ/L-%-% IV SOLN
INTRAVENOUS | Status: DC
  Filled 2019-11-20: qty 1000

## 2019-11-20 MED ORDER — DEXAMETHASONE SODIUM PHOSPHATE 10 MG/ML IJ SOLN
INTRAMUSCULAR | Status: DC | PRN
  Administered 2019-11-20: 10 mg via INTRAVENOUS

## 2019-11-20 MED ORDER — MIDAZOLAM HCL 2 MG/2ML IJ SOLN
INTRAMUSCULAR | Status: AC
  Filled 2019-11-20: qty 2

## 2019-11-20 MED ORDER — ONDANSETRON HCL 4 MG/2ML IJ SOLN: 4.0000 mg | Freq: Four times a day (QID) | INTRAMUSCULAR | Status: DC | PRN

## 2019-11-20 MED ORDER — ROCURONIUM BROMIDE 100 MG/10ML IV SOLN
INTRAVENOUS | Status: DC | PRN
  Administered 2019-11-20: 50 mg via INTRAVENOUS

## 2019-11-20 MED ORDER — LACTATED RINGERS IR SOLN
Status: DC | PRN
  Administered 2019-11-20: 1000 mL

## 2019-11-20 MED ORDER — LIDOCAINE HCL (CARDIAC) PF 100 MG/5ML IV SOSY
PREFILLED_SYRINGE | INTRAVENOUS | Status: DC | PRN
  Administered 2019-11-20: 60 mg via INTRAVENOUS

## 2019-11-20 MED ORDER — BUPIVACAINE-EPINEPHRINE 0.5% -1:200000 IJ SOLN
INTRAMUSCULAR | Status: DC | PRN
  Administered 2019-11-20: 30 mL

## 2019-11-20 MED ORDER — OXYCODONE HCL 5 MG PO TABS: 5.0000 mg | ORAL_TABLET | Freq: Once | ORAL | Status: DC | PRN

## 2019-11-20 MED ORDER — PROMETHAZINE HCL 25 MG/ML IJ SOLN: 6.2500 mg | INTRAMUSCULAR | Status: DC | PRN

## 2019-11-20 MED ORDER — DEXAMETHASONE SODIUM PHOSPHATE 10 MG/ML IJ SOLN
INTRAMUSCULAR | Status: AC
  Filled 2019-11-20: qty 1

## 2019-11-20 MED ORDER — ONDANSETRON HCL 4 MG/2ML IJ SOLN
INTRAMUSCULAR | Status: AC
  Filled 2019-11-20: qty 2

## 2019-11-20 MED ORDER — EPHEDRINE SULFATE-NACL 50-0.9 MG/10ML-% IV SOSY
PREFILLED_SYRINGE | INTRAVENOUS | Status: DC | PRN
  Administered 2019-11-20 (×2): 5 mg via INTRAVENOUS
  Administered 2019-11-20: 10 mg via INTRAVENOUS

## 2019-11-20 MED ORDER — ACETAMINOPHEN 500 MG PO TABS
1000.0000 mg | ORAL_TABLET | Freq: Once | ORAL | Status: AC
  Administered 2019-11-20: 1000 mg via ORAL
  Filled 2019-11-20: qty 2

## 2019-11-20 MED ORDER — IOHEXOL 300 MG/ML  SOLN
INTRAMUSCULAR | Status: DC | PRN
  Administered 2019-11-20: 12:00:00 6 mL

## 2019-11-20 MED ORDER — MIDAZOLAM HCL 5 MG/5ML IJ SOLN
INTRAMUSCULAR | Status: DC | PRN
  Administered 2019-11-20: 2 mg via INTRAVENOUS

## 2019-11-20 MED ORDER — FENTANYL CITRATE (PF) 250 MCG/5ML IJ SOLN
INTRAMUSCULAR | Status: AC
  Filled 2019-11-20: qty 5

## 2019-11-20 MED ORDER — ONDANSETRON HCL 4 MG/2ML IJ SOLN
INTRAMUSCULAR | Status: DC | PRN
  Administered 2019-11-20: 4 mg via INTRAVENOUS

## 2019-11-20 MED ORDER — ONDANSETRON 4 MG PO TBDP: 4.0000 mg | ORAL_TABLET | Freq: Four times a day (QID) | ORAL | Status: DC | PRN

## 2019-11-20 MED ORDER — 0.9 % SODIUM CHLORIDE (POUR BTL) OPTIME
TOPICAL | Status: DC | PRN
  Administered 2019-11-20: 1000 mL

## 2019-11-20 MED ORDER — LACTATED RINGERS IV SOLN: INTRAVENOUS | Status: DC

## 2019-11-20 SURGICAL SUPPLY — 35 items
APPLIER CLIP ROT 10 11.4 M/L (STAPLE)
CABLE HIGH FREQUENCY MONO STRZ (ELECTRODE) ×3 IMPLANT
CHLORAPREP W/TINT 26 (MISCELLANEOUS) ×3 IMPLANT
CLIP APPLIE ROT 10 11.4 M/L (STAPLE) IMPLANT
CLOSURE WOUND 1/2 X4 (GAUZE/BANDAGES/DRESSINGS)
COVER MAYO STAND STRL (DRAPES) ×3 IMPLANT
COVER SURGICAL LIGHT HANDLE (MISCELLANEOUS) ×3 IMPLANT
COVER WAND RF STERILE (DRAPES) IMPLANT
DECANTER SPIKE VIAL GLASS SM (MISCELLANEOUS) ×3 IMPLANT
DERMABOND ADVANCED (GAUZE/BANDAGES/DRESSINGS) ×2
DERMABOND ADVANCED .7 DNX12 (GAUZE/BANDAGES/DRESSINGS) ×1 IMPLANT
DRAPE C-ARM 42X120 X-RAY (DRAPES) ×3 IMPLANT
ELECT REM PT RETURN 15FT ADLT (MISCELLANEOUS) ×3 IMPLANT
GAUZE SPONGE 2X2 8PLY STRL LF (GAUZE/BANDAGES/DRESSINGS) ×1 IMPLANT
GLOVE SURG ORTHO 8.0 STRL STRW (GLOVE) ×3 IMPLANT
GOWN STRL REUS W/TWL XL LVL3 (GOWN DISPOSABLE) ×6 IMPLANT
HEMOSTAT SURGICEL 4X8 (HEMOSTASIS) IMPLANT
KIT BASIN OR (CUSTOM PROCEDURE TRAY) ×3 IMPLANT
KIT TURNOVER KIT A (KITS) IMPLANT
PENCIL SMOKE EVACUATOR (MISCELLANEOUS) IMPLANT
POUCH SPECIMEN RETRIEVAL 10MM (ENDOMECHANICALS) ×3 IMPLANT
SCISSORS LAP 5X35 DISP (ENDOMECHANICALS) ×3 IMPLANT
SET CHOLANGIOGRAPH MIX (MISCELLANEOUS) ×3 IMPLANT
SET IRRIG TUBING LAPAROSCOPIC (IRRIGATION / IRRIGATOR) ×3 IMPLANT
SET TUBE SMOKE EVAC HIGH FLOW (TUBING) IMPLANT
SLEEVE XCEL OPT CAN 5 100 (ENDOMECHANICALS) ×3 IMPLANT
SPONGE GAUZE 2X2 STER 10/PKG (GAUZE/BANDAGES/DRESSINGS) ×2
STRIP CLOSURE SKIN 1/2X4 (GAUZE/BANDAGES/DRESSINGS) IMPLANT
SUT MNCRL AB 4-0 PS2 18 (SUTURE) ×3 IMPLANT
TOWEL OR 17X26 10 PK STRL BLUE (TOWEL DISPOSABLE) ×3 IMPLANT
TOWEL OR NON WOVEN STRL DISP B (DISPOSABLE) ×3 IMPLANT
TRAY LAPAROSCOPIC (CUSTOM PROCEDURE TRAY) ×3 IMPLANT
TROCAR BLADELESS OPT 5 100 (ENDOMECHANICALS) ×3 IMPLANT
TROCAR XCEL BLUNT TIP 100MML (ENDOMECHANICALS) ×3 IMPLANT
TROCAR XCEL NON-BLD 11X100MML (ENDOMECHANICALS) ×3 IMPLANT

## 2019-11-20 NOTE — Anesthesia Postprocedure Evaluation (Signed)
Anesthesia Post Note  Patient: Sherry Fernandez  Procedure(s) Performed: LAPAROSCOPIC CHOLECYSTECTOMY WITH INTRAOPERATIVE CHOLANGIOGRAM (N/A Abdomen)     Patient location during evaluation: PACU Anesthesia Type: General Level of consciousness: awake and alert Pain management: pain level controlled Vital Signs Assessment: post-procedure vital signs reviewed and stable Respiratory status: spontaneous breathing, nonlabored ventilation, respiratory function stable and patient connected to nasal cannula oxygen Cardiovascular status: blood pressure returned to baseline and stable Postop Assessment: no apparent nausea or vomiting Anesthetic complications: no    Last Vitals:  Vitals:   11/20/19 1345 11/20/19 1439  BP: 112/63 121/65  Pulse: 74 76  Resp: 16 17  Temp: (!) 36.3 C (!) 36.4 C  SpO2: 98% 97%    Last Pain:  Vitals:   11/20/19 1439  TempSrc: Oral  PainSc:                  [name]

## 2019-11-20 NOTE — Progress Notes (Signed)
Pt only voiding 50cc amounts, and bladder scanning showing 329mls. We ambulated and attempted voiding several more times. Finally patient was having discomfort with bladder scan showing only 370mls in bladder. I/O cathed for 65mls.

## 2019-11-20 NOTE — Anesthesia Preprocedure Evaluation (Addendum)
Anesthesia Evaluation  Patient identified by MRN, date of birth, ID band Patient awake    Reviewed: Allergy & Precautions, H&P , NPO status , Patient's Chart, lab work & pertinent test results  Airway Mallampati: I  TM Distance: >3 FB Neck ROM: Full    Dental no notable dental hx. (+) Teeth Intact, Implants, Dental Advisory Given   Pulmonary neg pulmonary ROS,    Pulmonary exam normal breath sounds clear to auscultation       Cardiovascular Exercise Tolerance: Good hypertension, Pt. on medications Normal cardiovascular exam Rhythm:Regular Rate:Normal  Last echo 2018: Left ventricle: The cavity size was normal. Wall thickness wasincreased in a pattern of mild LVH. Systolic function was normal.The estimated ejection fraction was in the range of 60% to 65%.Doppler parameters are consistent with abnormal left ventricularrelaxation (grade 1 diastolic dysfunction).    Neuro/Psych  Headaches, Cervical spondylosis with radiculopathy- has not had neck surgery negative psych ROS   GI/Hepatic Neg liver ROS, GERD  Medicated and Controlled,Gallstones    Endo/Other  Hypothyroidism   Renal/GU Renal InsufficiencyRenal diseaseRenal ca s/p L nephrectomy 10/2019, last Cr 1.17  negative genitourinary   Musculoskeletal  (+) Arthritis , Osteoarthritis,    Abdominal Normal abdominal exam  (+)   Peds negative pediatric ROS (+)  Hematology negative hematology ROS (+)   Anesthesia Other Findings   Reproductive/Obstetrics negative OB ROS Post-menopausal                           Anesthesia Physical Anesthesia Plan  ASA: III  Anesthesia Plan: General   Post-op Pain Management:    Induction: Intravenous  PONV Risk Score and Plan: 4 or greater and Ondansetron, Dexamethasone and Midazolam  Airway Management Planned: Oral ETT  Additional Equipment: None  Intra-op Plan:   Post-operative Plan: Extubation in  OR  Informed Consent: I have reviewed the patients History and Physical, chart, labs and discussed the procedure including the risks, benefits and alternatives for the proposed anesthesia with the patient or authorized representative who has indicated his/her understanding and acceptance.     Dental advisory given  Plan Discussed with: CRNA  Anesthesia Plan Comments:         Anesthesia Quick Evaluation

## 2019-11-20 NOTE — Transfer of Care (Signed)
Immediate Anesthesia Transfer of Care Note  Patient: Sherry Fernandez  Procedure(s) Performed: LAPAROSCOPIC CHOLECYSTECTOMY WITH INTRAOPERATIVE CHOLANGIOGRAM (N/A Abdomen)  Patient Location: PACU  Anesthesia Type:General  Level of Consciousness: drowsy, patient cooperative and responds to stimulation  Airway & Oxygen Therapy: Patient Spontanous Breathing and Patient connected to face mask oxygen  Post-op Assessment: Report given to RN and Post -op Vital signs reviewed and stable  Post vital signs: Reviewed and stable  Last Vitals:  Vitals Value Taken Time  BP 126/72 11/20/19 1246  Temp 36.4 C 11/20/19 1246  Pulse 89 11/20/19 1249  Resp 11 11/20/19 1249  SpO2 100 % 11/20/19 1249  Vitals shown include unvalidated device data.  Last Pain:  Vitals:   11/20/19 0933  TempSrc:   PainSc: 0-No pain         Complications: No apparent anesthesia complications

## 2019-11-20 NOTE — Anesthesia Procedure Notes (Signed)
Procedure Name: Intubation Date/Time: 11/20/2019 11:31 AM Performed by: Glory Buff, CRNA Pre-anesthesia Checklist: Patient identified, Emergency Drugs available, Suction available and Patient being monitored Patient Re-evaluated:Patient Re-evaluated prior to induction Oxygen Delivery Method: Circle system utilized Preoxygenation: Pre-oxygenation with 100% oxygen Induction Type: IV induction Ventilation: Mask ventilation without difficulty Laryngoscope Size: Miller and 3 Grade View: Grade I Tube type: Oral Tube size: 7.0 mm Number of attempts: 1 Airway Equipment and Method: Stylet and Oral airway Placement Confirmation: ETT inserted through vocal cords under direct vision,  positive ETCO2 and breath sounds checked- equal and bilateral Secured at: 21 cm Tube secured with: Tape Dental Injury: Teeth and Oropharynx as per pre-operative assessment

## 2019-11-20 NOTE — Op Note (Signed)
Procedure Note  Pre-operative Diagnosis:  Adenomyosis of gallbladder, abnormal gallbladder ultrasound  Post-operative Diagnosis:  same  Surgeon:  Armandina Gemma, MD  Assistant:  Carlena Hurl, PA-C   Procedure:  Laparoscopic cholecystectomy with intra-operative cholangiography  Anesthesia:  General  Estimated Blood Loss:  minimal  Drains: none         Specimen: gallbladder to pathology  Indications:  Patient is referred by Dr. Julieanne Manson for surgical evaluation and management of possible gallstones or gallbladder polyps. Patient's primary care physician is Dr. Deland Pretty. Patient had experienced sudden onset of chest pressure and upper abdominal discomfort in September 2020. She contacted her primary care physician and was referred to gastroenterology. She was evaluated at [facility]. She underwent upper endoscopy and colonoscopy, both of which were normal studies. She also underwent an abdominal ultrasound which showed questionable gallstones versus a possible gallbladder polyp. Also noted however was a 4 cm mass in her left kidney. Patient subsequently underwent evaluation and one week ago underwent laparoscopic assisted left nephrectomy by Dr. Phebe Colla for renal cell carcinoma. She has seen Dr. Julieanne Manson at the cancer center in consultation. She is scheduled for an MRI scan of the liver on Monday to evaluate a potential liver lesion. Patient is now referred for consideration for cholecystectomy based on the original ultrasound study. Patient has had normal liver enzymes. She denies any history of pancreatitis or hepatitis. There is a family history of gallbladder disease and the patient's mother. She presents today accompanied by her husband for further evaluation and recommendations. We reviewed the report from [facility] in detail. The ultrasound says that there is a question of polyp or gallstones without gallbladder wall thickening or  pericholecystic fluid. There is no ductal dilatation. No measurements were taken on any structures within the gallbladder.  Procedure Details:  The patient was seen in the pre-op holding area. The risks, benefits, complications, treatment options, and expected outcomes were previously discussed with the patient. The patient agreed with the proposed plan and has signed the informed consent form.  The patient was transported to operating room # 4 at the [facility]. The patient was placed in the supine position on the operating room table. Following induction of general anesthesia, the abdomen was prepped and draped in the usual aseptic fashion.  An incision was made in the skin near the umbilicus. The midline fascia was incised and the peritoneal cavity was entered and a Hasson cannula was introduced under direct vision. The cannula was secured with a 0-Vicryl pursestring suture. Pneumoperitoneum was established with carbon dioxide. Additional cannulae were introduced under direct vision along the right costal margin in the midline, mid-clavicular line, and anterior axillary line.   The gallbladder was identified and the fundus grasped and retracted cephalad. Adhesions were taken down bluntly and the electrocautery was utilized as needed, taking care not to involve any adjacent structures. The infundibulum was grasped and retracted laterally, exposing the peritoneum overlying the triangle of Calot. The peritoneum was incised and structures exposed with blunt dissection. The cystic duct was clearly identified, bluntly dissected circumferentially, and clipped at the neck of the gallbladder.  An incision was made in the cystic duct and the cholangiogram catheter introduced. The catheter was secured using an ligaclip.  Real-time cholangiography was performed using C-arm fluoroscopy.  There was rapid filling of a normal caliber common bile duct.  There was reflux of contrast into the left and right  hepatic ductal systems.  There was free  flow distally into the duodenum without filling defect or obstruction.  The catheter was removed from the peritoneal cavity.  The cystic duct was then ligated with ligaclips and divided. The cystic artery was identified, dissected circumferentially, ligated with ligaclips, and divided.  The gallbladder was dissected away from the gallbladder bed using the electrocautery for hemostasis. The gallbladder was completely removed from the liver and placed into an endocatch bag. The gallbladder was removed in the endocatch bag through the umbilical port site and submitted to pathology for review.  The right upper quadrant was irrigated and the gallbladder bed was inspected. Hemostasis was achieved with the electrocautery.  Cannulae were removed under direct vision and good hemostasis was noted. Pneumoperitoneum was released and the majority of the carbon dioxide evacuated. The umbilical wound was irrigated and the fascia was then closed with the pursestring suture.  Local anesthetic was infiltrated at all port sites. Skin incisions were closed with 4-0 Monocril subcuticular sutures and Dermabond was applied.  Instrument, sponge, and needle counts were correct at the conclusion of the case.  The patient was awakened from anesthesia and brought to the recovery room in stable condition.  The patient tolerated the procedure well.   Armandina Gemma, MD [facility] Office: [phone]

## 2019-11-20 NOTE — Interval H&P Note (Signed)
History and Physical Interval Note:  11/20/2019 10:51 AM  Sherry Fernandez  has presented today for surgery, with the diagnosis of Gallstones.  The various methods of treatment have been discussed with the patient and family. After consideration of risks, benefits and other options for treatment, the patient has consented to    Procedure(s): LAPAROSCOPIC CHOLECYSTECTOMY WITH INTRAOPERATIVE CHOLANGIOGRAM (N/A) as a surgical intervention.    The patient's history has been reviewed, patient examined, no change in status, stable for surgery.  I have reviewed the patient's chart and labs.  Questions were answered to the patient's satisfaction.    Armandina Gemma, MD [facility] Office: [place]

## 2019-11-20 NOTE — Plan of Care (Signed)
  Problem: Education: Goal: Required Educational Video(s) Outcome: Progressing   Problem: Clinical Measurements: Goal: Postoperative complications will be avoided or minimized Outcome: Progressing   Problem: Skin Integrity: Goal: Demonstration of wound healing without infection will improve Outcome: Progressing   Problem: Education: Goal: Knowledge of General Education information will improve Description: Including pain rating scale, medication(s)/side effects and non-pharmacologic comfort measures Outcome: Progressing

## 2019-11-21 DIAGNOSIS — K828 Other specified diseases of gallbladder: Secondary | ICD-10-CM | POA: Diagnosis not present

## 2019-11-21 LAB — SURGICAL PATHOLOGY

## 2019-11-21 MED ORDER — OXYCODONE HCL 5 MG PO TABS: 5.0000 mg | ORAL_TABLET | ORAL | 0 refills | Status: DC | PRN

## 2019-11-21 NOTE — Discharge Summary (Signed)
Physician Discharge Summary [facility]  Patient ID: Sherry Fernandez MRN: [id number] DOB/AGE: 1961-06-21 59 y.o.  Admit date: 11/20/2019 Discharge date: 11/21/2019  Admission Diagnoses:  Abnormal gallbladder ultrasound, adenomyosis  Discharge Diagnoses:  Principal Problem:   Abnormal ultrasound of gallbladder Active Problems:   Adenomyosis of gallbladder   Discharged Condition: good  Hospital Course: Patient was admitted for observation following gallbladder surgery.  Post op course was uncomplicated.  Pain was well controlled.  Tolerated diet.  Patient did require one episode of I&O catheterization.  Patient was prepared for discharge home on POD#1.  Consults: None  Treatments: surgery: lap chole with IOC  Discharge Exam: Blood pressure (!) 109/54, pulse 61, temperature 99 F (37.2 C), temperature source Oral, resp. rate 18, height 5\' 4"  (1.626 m), weight 69.2 kg, SpO2 95 %. HEENT - clear Neck - soft Chest - clear bilaterally Cor - RRR Abd - soft without distension; wounds dry and intact  Disposition: Home  Discharge Instructions    Diet - low sodium heart healthy   Complete by: As directed    Discharge instructions   Complete by: As directed    [facility]  LAPAROSCOPIC SURGERY:  POST-OP INSTRUCTIONS  Always review your discharge instruction sheet given to you by the facility where your surgery was performed.  A prescription for pain medication may be given to you upon discharge.  Take your pain medication as prescribed.  If narcotic pain medicine is not needed, then you may take acetaminophen (Tylenol) or ibuprofen (Advil) as needed.  Take your usually prescribed medications unless otherwise directed.  If you need a refill on your pain medication, please contact your pharmacy.  They will contact our office to request authorization. Prescriptions will not be filled after 5 P.M. or on weekends.  You should follow a light  diet the first few days after arrival home, such as soup and crackers or toast.  Be sure to include plenty of fluids daily.  Most patients will experience some swelling and bruising in the area of the incisions.  Ice packs will help.  Swelling and bruising can take several days to resolve.   It is common to experience some constipation after surgery.  Increasing fluid intake and taking a stool softener (such as Colace) will usually help or prevent this problem from occurring.  A mild laxative (Milk of Magnesia or Miralax) should be taken according to package instructions if there has been no bowel movement after 48 hours.  You will likely have Dermabond (topical glue) over your incisions.  This seals the incisions and allows you to bathe and shower at any time after your surgery.  Glue should remain in place for up to 10 days.  It may be removed after 10 days by pealing off the Dermabond material or using Vaseline or naval jelly to remove.  If you have steri-strips over your incisions, you may remove the gauze bandage on the second day after surgery, and you may shower at that time.  Leave your steri-strips (small skin tapes) in place directly over the incision.  These strips should remain on the skin for 5-7 days and then be removed.  You may get them wet in the shower and pat them dry.  Any sutures or staples will be removed at the office during your follow-up visit.  ACTIVITIES:  You may resume regular (light) daily activities beginning the next day - such as daily self-care, walking, climbing stairs - gradually  increasing activities as tolerated.  You may have sexual intercourse when it is comfortable.  Refrain from any heavy lifting or straining until approved by your doctor.  You may drive when you are no longer taking prescription pain medication, when you can comfortably wear a seatbelt, and when you can safely maneuver your car and apply brakes.  You should see your doctor in the office for  a follow-up appointment approximately 2-3 weeks after your surgery.  Make sure that you call for this appointment within a day or two after you arrive home to insure a convenient appointment time.  WHEN TO CALL YOUR DOCTOR: Fever over 101.0 Inability to urinate Continued bleeding from incision Increased pain, redness, or drainage from the incision Increasing abdominal pain  The clinic staff is available to answer your questions during regular business hours.  Please don't hesitate to call and ask to speak to one of the nurses for clinical concerns.  If you have a medical emergency, go to the nearest emergency room or call 911.  A surgeon from [facility] is always on call for the hospital.  Earnstine Regal, MD, [facility] Office: [place] Free:  [place] [phone]  Website: [email or web address]   Increase activity slowly   Complete by: As directed    No dressing needed   Complete by: As directed      Allergies as of 11/21/2019      Reactions   Sulfamethoxazole Swelling      Medication List    TAKE these medications   Ajovy 225 MG/1.5ML Sosy Generic drug: Fremanezumab-vfrm Inject 1.5 mLs into the skin every 30 (thirty) days. migrane prevention   Dexilant 60 MG capsule Generic drug: dexlansoprazole Take 60 mg by mouth daily before breakfast.   docusate sodium 100 MG capsule Commonly known as: COLACE Take 100 mg by mouth 2 (two) times daily as needed for mild constipation.   famotidine 40 MG tablet Commonly known as: PEPCID Take 40 mg by mouth at bedtime as needed for heartburn or indigestion.   oxyCODONE 5 MG immediate release tablet Commonly known as: Oxy IR/ROXICODONE Take 1-2 tablets (5-10 mg total) by mouth every 4 (four) hours as needed for moderate pain.   psyllium 28 % packet Commonly known as: METAMUCIL SMOOTH TEXTURE Take 1 packet by mouth 2 (two) times daily as needed (regularity).     rizatriptan 10 MG tablet Commonly known as: MAXALT Take 10 mg by mouth every 2 (two) hours as needed for migraine. May repeat in 2 hours if needed   sennosides-docusate sodium 8.6-50 MG tablet Commonly known as: SENOKOT-S Take 1 tablet by mouth daily as needed for constipation.      Follow-up Information    Armandina Gemma, MD. Schedule an appointment as soon as possible for a visit in 3 week(s).   Specialty: General Surgery Contact information: [address] [place] [address] [phone]           Earnstine Regal, MD, [facility] Office: [phone]   Signed: Armandina Gemma 11/21/2019, 8:32 AM

## 2019-11-21 NOTE — Progress Notes (Signed)
Please contact patient and notify of benign pathology results.  Pailyn Bellevue M. Shedrick Sarli, MD, FACS [facility] Office: [phone]   

## 2019-11-21 NOTE — Progress Notes (Signed)
Discharge and medication instructions reviewed with patient and spouse. Questions answered and both deny further questions. No prescriptions given. Spouse is driving patient home. Donne Hazel, RN

## 2020-04-26 IMAGING — CT CT HEART MORP W/ CTA COR W/ SCORE W/ CA W/CM &/OR W/O CM
4 of 7 series · 8 of 20 positions shown, 9 images · IV contrast (APPLIED)
Comparison: Chest radiograph 10/26/2015.
COMPARISON: Chest radiograph 10/26/2015.

Addendum:
EXAM:
OVER-READ INTERPRETATION  CT CHEST

The following report is an over-read performed by radiologist Dr.
Oti Harry [REDACTED] on 05/02/2019. This over-read
does not include interpretation of cardiac or coronary anatomy or
pathology. The coronary CTA interpretation by the cardiologist is
attached.
TECHNIQUE: The patient was scanned on a Phillips Force scanner.

[Series 6: best diast 74 % · axial · 0.37mm/px · z∈[+989,+1030]mm · 2 of 303 slices shown, 3 images]
[im 101/303  vessel]
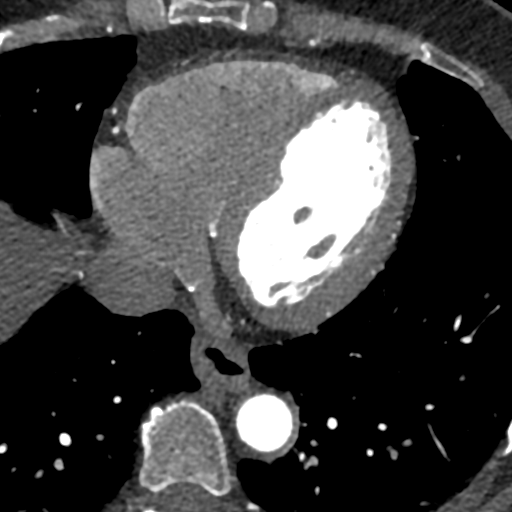
[im 101/303  lung]
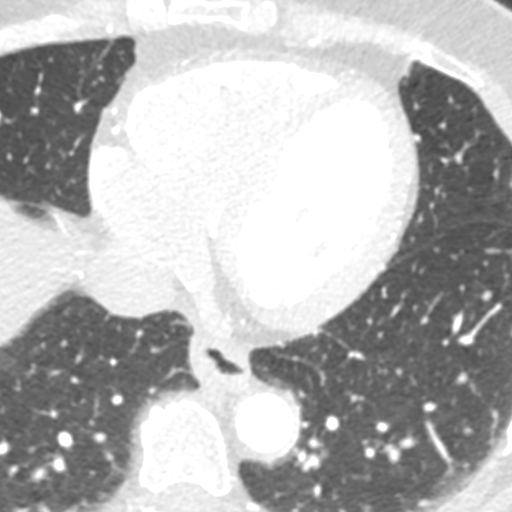
[im 202/303  vessel]
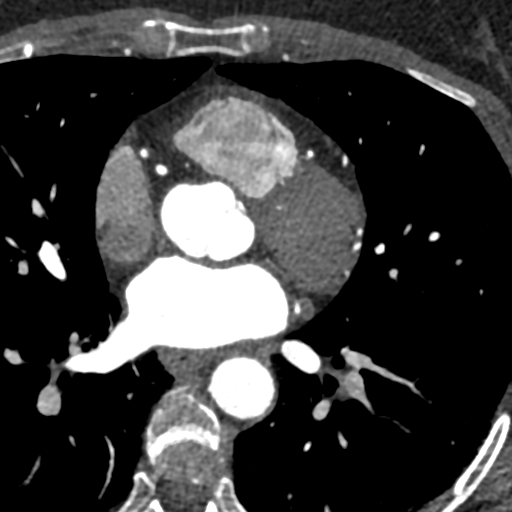

[Series 7: best syst 74 % · axial · 0.37mm/px · z∈[+989,+1030]mm · 2 of 303 slices shown]
[im 101/303  vessel]
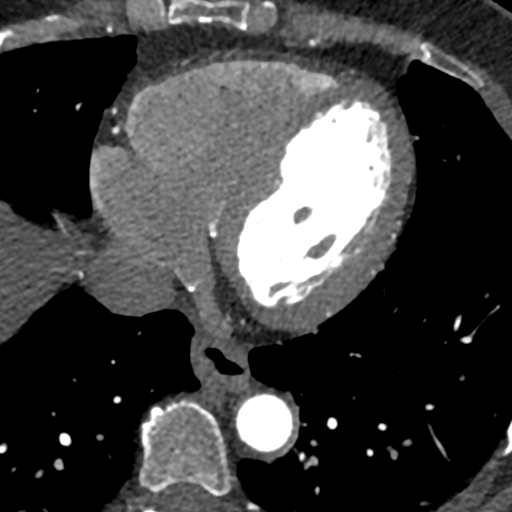
[im 202/303  vessel]
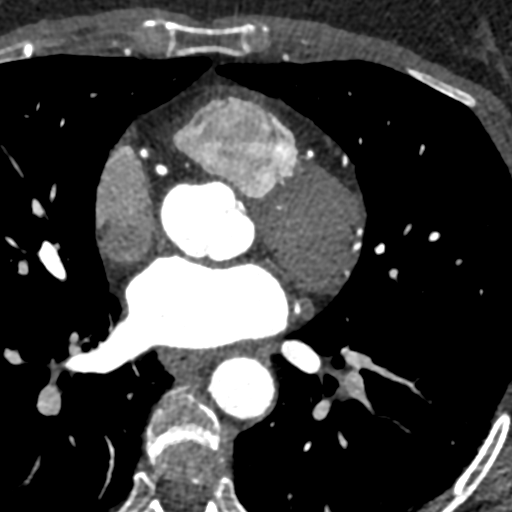

[Series 8: ts diast sharp 74 % · axial · 0.37mm/px · z∈[+989,+1030]mm · 2 of 303 slices shown]
[im 101/303  lung]
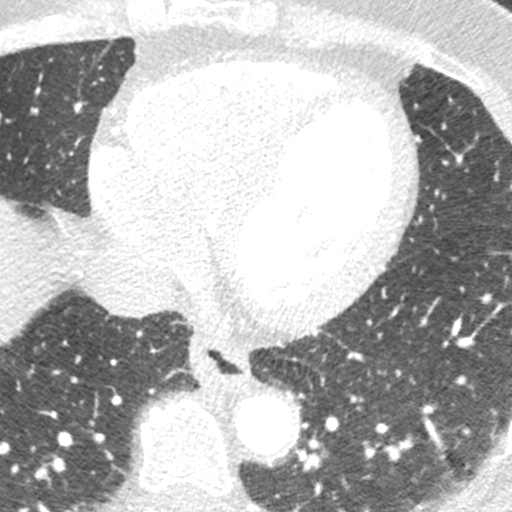
[im 202/303  lung]
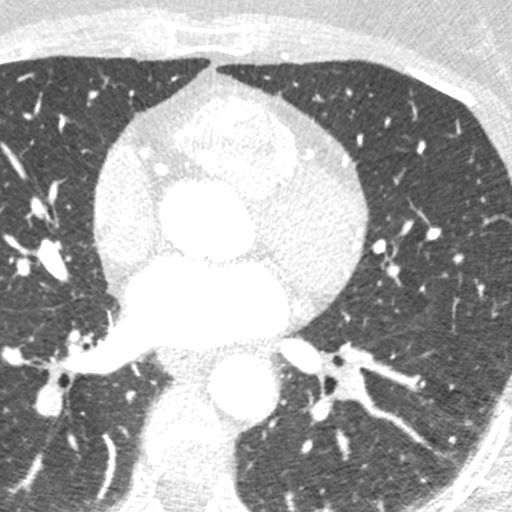

[Series 9: ts syst sharp 74 % · axial · 0.37mm/px · z∈[+989,+1030]mm · 2 of 303 slices shown]
[im 101/303  lung]
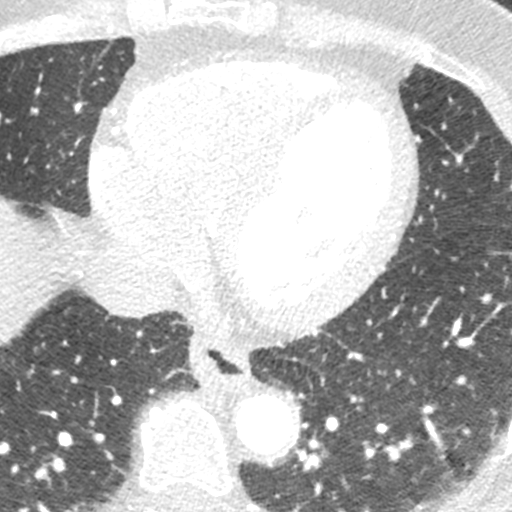
[im 202/303  lung]
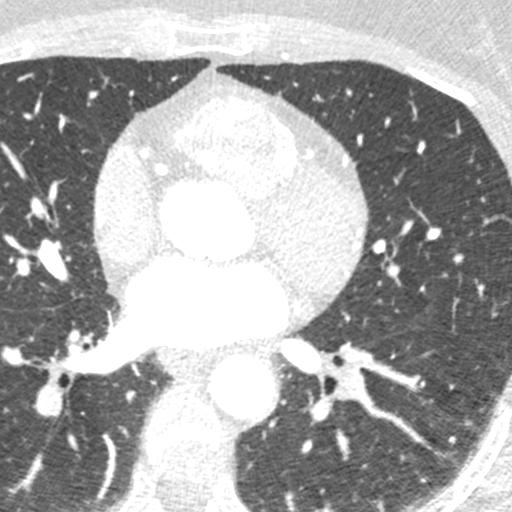

[8 of 20 positions shown; findings below may reference images not displayed]

FINDINGS: Vascular: Normal aortic caliber. No central pulmonary embolism, on
this non-dedicated study.

Mediastinum/Nodes: No imaged thoracic adenopathy. Small hiatal
hernia.

Lungs/Pleura: No pleural fluid. Subpleural right middle lobe 4 mm
nodule on image [DATE].

Upper Abdomen: Probable hepatic steatosis. Normal imaged portions of
the spleen.

Musculoskeletal: No acute osseous abnormality.
IMPRESSION: 1.  No acute findings in the imaged extracardiac chest.
2. Subpleural right middle lobe 4 mm nodule most likely represents a
subpleural lymph node. No follow-up needed if patient is low-risk.
Non-contrast chest CT can be considered in 12 months if patient is
high-risk. This recommendation follows the consensus statement:
Guidelines for Management of Incidental Pulmonary Nodules Detected
3. Small hiatal hernia.

EXAM:
Cardiac/Coronary  CT
FINDINGS: A 120 kV prospective scan was triggered in the descending thoracic
aorta at 111 HU's. Axial non-contrast 3 mm slices were carried out
through the heart. The data set was analyzed on a dedicated work
station and scored using the Agatson method. Gantry rotation speed
was 250 msecs and collimation was .6 mm. No beta blockade and 0.8 mg
of sl NTG was given. The 3D data set was reconstructed in 5%
intervals of the 67-82 % of the R-R cycle. Diastolic phases were
analyzed on a dedicated work station using MPR, MIP and VRT modes.
The patient received 80 cc of contrast.

Aorta:  Normal size.  No calcifications.  No dissection.

Aortic Valve:  Trileaflet.  No calcifications.

Coronary Arteries:  Normal coronary origin.  Right dominance.

RCA is a large dominant artery that gives rise to PDA and PLVB.
There is a mild non calcified plaque in the proximal RCA with
associated stenosis of 25-49% stenosis.

Left main is a large artery that gives rise to LAD, Ramus and LCX
arteries. There is no plaque.

LAD is a large vessel that gives rise to a moderate sized diagonal.
There is no plaque.

Ramus is a large branching vessel that has no plaque.

LCX is a non-dominant artery that gives rise to one moderate sized
OM1 branch. There is no plaque.

Other findings:

Normal pulmonary vein drainage into the left atrium.

Normal let atrial appendage without a thrombus.

Normal size of the pulmonary artery.
IMPRESSION: 1. Coronary calcium score of 0. This was 0 percentile for age and
sex matched control.

2. Normal coronary origin with right dominance.

3. Mild atherosclerosis of the proximal RCA with non calcified
plaque. CAD-RADS 2.

4. Recommend aggressive risk factor modification and medical
therapy.

Lily Montalbo

*** End of Addendum ***
EXAM:
OVER-READ INTERPRETATION  CT CHEST

The following report is an over-read performed by radiologist Dr.
Oti Harry [REDACTED] on 05/02/2019. This over-read
does not include interpretation of cardiac or coronary anatomy or
pathology. The coronary CTA interpretation by the cardiologist is
attached.
FINDINGS: Vascular: Normal aortic caliber. No central pulmonary embolism, on
this non-dedicated study.

Mediastinum/Nodes: No imaged thoracic adenopathy. Small hiatal
hernia.

Lungs/Pleura: No pleural fluid. Subpleural right middle lobe 4 mm
nodule on image [DATE].

Upper Abdomen: Probable hepatic steatosis. Normal imaged portions of
the spleen.

Musculoskeletal: No acute osseous abnormality.
IMPRESSION: 1.  No acute findings in the imaged extracardiac chest.
2. Subpleural right middle lobe 4 mm nodule most likely represents a
subpleural lymph node. No follow-up needed if patient is low-risk.
Non-contrast chest CT can be considered in 12 months if patient is
high-risk. This recommendation follows the consensus statement:
Guidelines for Management of Incidental Pulmonary Nodules Detected
3. Small hiatal hernia.

## 2020-05-08 ENCOUNTER — Ambulatory Visit (HOSPITAL_COMMUNITY)
Admission: RE | Admit: 2020-05-08 | Discharge: 2020-05-08 | Disposition: A | Discharge status: Home / Self Care | Payer: 59 | Source: Ambulatory Visit | Attending: Urology | Admitting: Urology

## 2020-05-08 ENCOUNTER — Other Ambulatory Visit (HOSPITAL_COMMUNITY): Payer: Self-pay | Admitting: Urology

## 2020-05-08 ENCOUNTER — Other Ambulatory Visit: Payer: Self-pay

## 2020-05-08 DIAGNOSIS — C642 Malignant neoplasm of left kidney, except renal pelvis: Secondary | ICD-10-CM

## 2020-09-02 ENCOUNTER — Telehealth: Payer: Self-pay | Admitting: Physician Assistant

## 2020-09-02 NOTE — Telephone Encounter (Signed)
Called to discuss with patient about Covid symptoms and the use of a monoclonal antibody infusion for those with mild to moderate Covid symptoms and at a high risk of hospitalization.   Pt is qualified for the monoclonal antibody infusion, but due to medication shortages we cannot offer the pt the infusion at this time. This decision was based on clinical judgement as well as the the NIH Covid 19 treatment guidelines for treatment prioritization and equitable access. Symptoms tier reviewed as well as criteria for ending isolation. Preventative practices reviewed. Patient verbalized understanding.  Pt is feeling much better at this time. She is on day 7 today. Of note, she was fully vaccinated but not boosted.   Cline Crock, PA-C  09/02/2020 8:31 AM

## 2020-11-12 ENCOUNTER — Other Ambulatory Visit: Payer: Self-pay | Admitting: Internal Medicine

## 2020-11-12 DIAGNOSIS — I6523 Occlusion and stenosis of bilateral carotid arteries: Secondary | ICD-10-CM

## 2020-12-03 DIAGNOSIS — L821 Other seborrheic keratosis: Secondary | ICD-10-CM | POA: Diagnosis not present

## 2020-12-03 DIAGNOSIS — D2272 Melanocytic nevi of left lower limb, including hip: Secondary | ICD-10-CM | POA: Diagnosis not present

## 2020-12-03 DIAGNOSIS — D225 Melanocytic nevi of trunk: Secondary | ICD-10-CM | POA: Diagnosis not present

## 2020-12-03 DIAGNOSIS — L812 Freckles: Secondary | ICD-10-CM | POA: Diagnosis not present

## 2021-01-15 DIAGNOSIS — K644 Residual hemorrhoidal skin tags: Secondary | ICD-10-CM | POA: Diagnosis not present

## 2021-04-09 DIAGNOSIS — Z0000 Encounter for general adult medical examination without abnormal findings: Secondary | ICD-10-CM | POA: Diagnosis not present

## 2021-04-14 DIAGNOSIS — K219 Gastro-esophageal reflux disease without esophagitis: Secondary | ICD-10-CM | POA: Diagnosis not present

## 2021-04-14 DIAGNOSIS — Z0000 Encounter for general adult medical examination without abnormal findings: Secondary | ICD-10-CM | POA: Diagnosis not present

## 2021-04-14 DIAGNOSIS — I6523 Occlusion and stenosis of bilateral carotid arteries: Secondary | ICD-10-CM | POA: Diagnosis not present

## 2021-04-14 DIAGNOSIS — E039 Hypothyroidism, unspecified: Secondary | ICD-10-CM | POA: Diagnosis not present

## 2021-04-14 DIAGNOSIS — E7800 Pure hypercholesterolemia, unspecified: Secondary | ICD-10-CM | POA: Diagnosis not present

## 2021-06-09 DIAGNOSIS — C642 Malignant neoplasm of left kidney, except renal pelvis: Secondary | ICD-10-CM | POA: Diagnosis not present

## 2021-06-16 ENCOUNTER — Other Ambulatory Visit (HOSPITAL_COMMUNITY): Payer: Self-pay | Admitting: Urology

## 2021-06-16 ENCOUNTER — Ambulatory Visit (HOSPITAL_COMMUNITY)
Admission: RE | Admit: 2021-06-16 | Discharge: 2021-06-16 | Disposition: A | Discharge status: Home / Self Care | Payer: BC Managed Care – PPO | Source: Ambulatory Visit | Attending: Urology | Admitting: Urology

## 2021-06-16 DIAGNOSIS — K449 Diaphragmatic hernia without obstruction or gangrene: Secondary | ICD-10-CM | POA: Diagnosis not present

## 2021-06-16 DIAGNOSIS — Z85528 Personal history of other malignant neoplasm of kidney: Secondary | ICD-10-CM | POA: Diagnosis not present

## 2021-06-16 DIAGNOSIS — Z905 Acquired absence of kidney: Secondary | ICD-10-CM | POA: Diagnosis not present

## 2021-06-16 DIAGNOSIS — K5730 Diverticulosis of large intestine without perforation or abscess without bleeding: Secondary | ICD-10-CM | POA: Diagnosis not present

## 2021-06-16 DIAGNOSIS — N281 Cyst of kidney, acquired: Secondary | ICD-10-CM | POA: Diagnosis not present

## 2021-06-16 DIAGNOSIS — C642 Malignant neoplasm of left kidney, except renal pelvis: Secondary | ICD-10-CM | POA: Diagnosis not present

## 2021-06-24 DIAGNOSIS — N3020 Other chronic cystitis without hematuria: Secondary | ICD-10-CM | POA: Diagnosis not present

## 2021-06-24 DIAGNOSIS — N393 Stress incontinence (female) (male): Secondary | ICD-10-CM | POA: Diagnosis not present

## 2021-06-24 DIAGNOSIS — C642 Malignant neoplasm of left kidney, except renal pelvis: Secondary | ICD-10-CM | POA: Diagnosis not present

## 2021-06-24 DIAGNOSIS — Z905 Acquired absence of kidney: Secondary | ICD-10-CM | POA: Diagnosis not present

## 2021-06-30 DIAGNOSIS — E039 Hypothyroidism, unspecified: Secondary | ICD-10-CM | POA: Diagnosis not present

## 2021-06-30 DIAGNOSIS — R7303 Prediabetes: Secondary | ICD-10-CM | POA: Diagnosis not present

## 2021-06-30 DIAGNOSIS — I6523 Occlusion and stenosis of bilateral carotid arteries: Secondary | ICD-10-CM | POA: Diagnosis not present

## 2021-07-09 DIAGNOSIS — Z85528 Personal history of other malignant neoplasm of kidney: Secondary | ICD-10-CM | POA: Diagnosis not present

## 2021-07-09 DIAGNOSIS — E7800 Pure hypercholesterolemia, unspecified: Secondary | ICD-10-CM | POA: Diagnosis not present

## 2021-07-09 DIAGNOSIS — R7303 Prediabetes: Secondary | ICD-10-CM | POA: Diagnosis not present

## 2021-07-09 DIAGNOSIS — I2510 Atherosclerotic heart disease of native coronary artery without angina pectoris: Secondary | ICD-10-CM | POA: Diagnosis not present

## 2022-01-05 ENCOUNTER — Encounter (HOSPITAL_BASED_OUTPATIENT_CLINIC_OR_DEPARTMENT_OTHER): Payer: Self-pay

## 2022-01-05 ENCOUNTER — Emergency Department (HOSPITAL_BASED_OUTPATIENT_CLINIC_OR_DEPARTMENT_OTHER)
Admission: EM | Admit: 2022-01-05 | Discharge: 2022-01-06 | Disposition: A | Discharge status: Home / Self Care | Payer: 59 | Attending: Emergency Medicine | Admitting: Emergency Medicine

## 2022-01-05 ENCOUNTER — Other Ambulatory Visit: Payer: Self-pay

## 2022-01-05 DIAGNOSIS — D72829 Elevated white blood cell count, unspecified: Secondary | ICD-10-CM | POA: Insufficient documentation

## 2022-01-05 DIAGNOSIS — R1030 Lower abdominal pain, unspecified: Secondary | ICD-10-CM | POA: Diagnosis present

## 2022-01-05 DIAGNOSIS — R42 Dizziness and giddiness: Secondary | ICD-10-CM | POA: Insufficient documentation

## 2022-01-05 DIAGNOSIS — K5732 Diverticulitis of large intestine without perforation or abscess without bleeding: Secondary | ICD-10-CM | POA: Insufficient documentation

## 2022-01-05 LAB — CBC WITH DIFFERENTIAL/PLATELET
Abs Immature Granulocytes: 0.06 10*3/uL (ref 0.00–0.07)
Basophils Absolute: 0 10*3/uL (ref 0.0–0.1)
Basophils Relative: 0 %
Eosinophils Absolute: 0.1 10*3/uL (ref 0.0–0.5)
Eosinophils Relative: 0 %
HCT: 40.8 % (ref 36.0–46.0)
Hemoglobin: 13.4 g/dL (ref 12.0–15.0)
Immature Granulocytes: 0 %
Lymphocytes Relative: 14 %
Lymphs Abs: 1.9 10*3/uL (ref 0.7–4.0)
MCH: 28.2 pg (ref 26.0–34.0)
MCHC: 32.8 g/dL (ref 30.0–36.0)
MCV: 85.9 fL (ref 80.0–100.0)
Monocytes Absolute: 1.3 10*3/uL — ABNORMAL HIGH (ref 0.1–1.0)
Monocytes Relative: 10 %
Neutro Abs: 10.4 10*3/uL — ABNORMAL HIGH (ref 1.7–7.7)
Neutrophils Relative %: 76 %
Platelets: 219 10*3/uL (ref 150–400)
RBC: 4.75 MIL/uL (ref 3.87–5.11)
RDW: 12.6 % (ref 11.5–15.5)
WBC: 13.8 10*3/uL — ABNORMAL HIGH (ref 4.0–10.5)
nRBC: 0 % (ref 0.0–0.2)

## 2022-01-05 MED ORDER — ACETAMINOPHEN 325 MG PO TABS
650.0000 mg | ORAL_TABLET | Freq: Once | ORAL | Status: AC
  Administered 2022-01-05: 650 mg via ORAL
  Filled 2022-01-05: qty 2

## 2022-01-05 MED ORDER — SODIUM CHLORIDE 0.9 % IV BOLUS
1000.0000 mL | Freq: Once | INTRAVENOUS | Status: AC
  Administered 2022-01-05: 1000 mL via INTRAVENOUS

## 2022-01-05 NOTE — ED Provider Notes (Signed)
[facility] EMERGENCY DEPT Provider Note   CSN: [id number] Arrival date & time: 01/05/22  2101     History  Chief Complaint  Patient presents with   Abdominal Pain    Sherry Fernandez is a 61 y.o. female who presents emergency department with chief complaint of abdominal pain and fever.  Patient states that for the past 3 weeks she has been having intermittent lower abdominal cramping pain.  She saw her primary care doctor because she has been having lightheadedness as well.  He did a work-up and checked her orthostatic vital signs which seemed borderline abnormal.  Patient states that she has also been having severe diarrhea every time she eats she immediately has to run to the bathroom.  She saw her OB/GYN because her pain has become progressively worse and now constant.  She had a urine sample taken and had a ultrasound this morning that were both negative.  Her doctor prophylactically begin her on oral Keflex.  He told her that if she was not getting any better she should come to the emergency department and tonight she developed a fever of 100.9 and came in for further evaluation.  She is status postcholecystectomy.  She has a history of renal cancer.  She denies urinary symptoms, vaginal symptoms, or vomiting  The history is provided by the patient and the spouse.  Abdominal Pain Pain location:  Suprapubic Pain quality: aching, bloating, cramping and fullness   Pain radiates to:  Does not radiate Pain severity:  Moderate Onset quality:  Gradual Duration:  3 weeks Timing:  Constant Progression:  Worsening Chronicity:  New     Home Medications Prior to Admission medications   Medication Sig Start Date End Date Taking? Authorizing Provider  DEXILANT 60 MG capsule Take 60 mg by mouth daily before breakfast. 08/15/19   [provider]  docusate sodium (COLACE) 100 MG capsule Take 100 mg by mouth 2 (two) times daily as needed for mild constipation.    [provider]  famotidine (PEPCID) 40 MG tablet Take 40 mg by mouth at bedtime as needed for heartburn or indigestion.    [provider]  Fremanezumab-vfrm (AJOVY) 225 MG/1.5ML SOSY Inject 1.5 mLs into the skin every 30 (thirty) days. migrane prevention    [provider]  oxyCODONE (OXY IR/ROXICODONE) 5 MG immediate release tablet Take 1-2 tablets (5-10 mg total) by mouth every 4 (four) hours as needed for moderate pain. 11/21/19   Armandina Gemma, MD  psyllium (METAMUCIL SMOOTH TEXTURE) 28 % packet Take 1 packet by mouth 2 (two) times daily as needed (regularity).    [provider]  rizatriptan (MAXALT) 10 MG tablet Take 10 mg by mouth every 2 (two) hours as needed for migraine. May repeat in 2 hours if needed     [provider]  sennosides-docusate sodium (SENOKOT-S) 8.6-50 MG tablet Take 1 tablet by mouth daily as needed for constipation.    [provider]      Allergies    Sulfamethoxazole    Review of Systems   Review of Systems  Gastrointestinal:  Positive for abdominal pain.   Physical Exam Updated Vital Signs BP 115/68   Pulse (!) 101   Temp (!) 100.9 F (38.3 C)   Resp 17   Ht '5\' 4"'$  (1.626 m)   Wt 65.8 kg   SpO2 92%   BMI 24.89 kg/m  Physical Exam Vitals and nursing note reviewed.  Constitutional:      General:  She is not in acute distress.    Appearance: She is well-developed. She is not diaphoretic.  HENT:     Head: Normocephalic and atraumatic.     Right Ear: External ear normal.     Left Ear: External ear normal.     Nose: Nose normal.     Mouth/Throat:     Mouth: Mucous membranes are moist.  Eyes:     General: No scleral icterus.    Conjunctiva/sclera: Conjunctivae normal.  Cardiovascular:     Rate and Rhythm: Normal rate and regular rhythm.     Heart sounds: Normal heart sounds. No murmur heard.   No friction rub. No gallop.  Pulmonary:     Effort: Pulmonary effort is normal. No respiratory distress.      Breath sounds: Normal breath sounds.  Abdominal:     General: Bowel sounds are normal. There is no distension.     Palpations: Abdomen is soft. There is no mass.     Tenderness: There is abdominal tenderness in the suprapubic area. There is no guarding.  Musculoskeletal:     Cervical back: Normal range of motion.  Skin:    General: Skin is warm and dry.  Neurological:     Mental Status: She is alert and oriented to person, place, and time.  Psychiatric:        Behavior: Behavior normal.    ED Results / Procedures / Treatments   Labs (all labs ordered are listed, but only abnormal results are displayed) Labs Reviewed  CULTURE, BLOOD (ROUTINE X 2)  CULTURE, BLOOD (ROUTINE X 2)  URINE CULTURE  CBC WITH DIFFERENTIAL/PLATELET  COMPREHENSIVE METABOLIC PANEL  LACTIC ACID, PLASMA  LACTIC ACID, PLASMA  URINALYSIS, ROUTINE W REFLEX MICROSCOPIC    EKG None  Radiology No results found.  Procedures Procedures    Medications Ordered in ED Medications  acetaminophen (TYLENOL) tablet 650 mg (650 mg Oral Given 01/05/22 2300)  sodium chloride 0.9 % bolus 1,000 mL (1,000 mLs Intravenous New Bag/Given 01/05/22 2256)    ED Course/ Medical Decision Making/ A&P Clinical Course as of 01/06/22 0017  Tue Jan 06, 2022  0006 CBC with elevated WBC, CMP with CKD at baseline. UA is neg for infection. Lactic acid is normal. Awaiting CT.  [CS]  0017 Urinalysis, Routine w reflex microscopic(!) [AH]  0017 Comprehensive metabolic panel(!) [AH]  9242 Creatinine(!): 1.29 Creatinine at baseline [AH]  0017 CBC with Differential(!) [AH]  0017 WBC(!): 13.8 White blood cell count about 13.8 [AH]    Clinical Course User Index [AH] Margarita Mail, PA-C [CS] Truddie Hidden, MD                           Medical Decision Making 61 year old female who presents emergency department with chief complaint of lower abdominal pain x3 weeks now worsening with associated fever.  She had a pelvic ultrasound  that showed no findings today.  Urine is unremarkable and does not appear to be the cause of her pain.  I have concern for potential colitis/diverticulitis.  Patient has a pending CT scan of the abdomen.  Signout given to Dr. Karle Starch at shift handoff.  Amount and/or Complexity of Data Reviewed Labs: ordered. Radiology: ordered.  Risk OTC drugs.   \        Final Clinical Impression(s) / ED Diagnoses Final diagnoses:  None    Rx / DC Orders ED Discharge Orders     None  Margarita Mail, PA-C 01/06/22 0018    Luna Fuse, MD 01/16/22 2242

## 2022-01-05 NOTE — ED Triage Notes (Signed)
Saw OB_GYN today [place] & bld work done there today.  [place] was negative  ?Rectal bleeding ( internal hemorrhoids) ?N/V for several weeks ?And new fever today ?

## 2022-01-06 ENCOUNTER — Emergency Department (HOSPITAL_BASED_OUTPATIENT_CLINIC_OR_DEPARTMENT_OTHER): Payer: 59

## 2022-01-06 LAB — COMPREHENSIVE METABOLIC PANEL
ALT: 9 U/L (ref 0–44)
AST: 14 U/L — ABNORMAL LOW (ref 15–41)
Albumin: 4.6 g/dL (ref 3.5–5.0)
Alkaline Phosphatase: 67 U/L (ref 38–126)
Anion gap: 12 (ref 5–15)
BUN: 20 mg/dL (ref 8–23)
CO2: 23 mmol/L (ref 22–32)
Calcium: 9.8 mg/dL (ref 8.9–10.3)
Chloride: 101 mmol/L (ref 98–111)
Creatinine, Ser: 1.29 mg/dL — ABNORMAL HIGH (ref 0.44–1.00)
GFR, Estimated: 47 mL/min — ABNORMAL LOW (ref >60)
Glucose, Bld: 121 mg/dL — ABNORMAL HIGH (ref 70–99)
Potassium: 3.9 mmol/L (ref 3.5–5.1)
Sodium: 136 mmol/L (ref 135–145)
Total Bilirubin: 1.2 mg/dL (ref 0.3–1.2)
Total Protein: 7.4 g/dL (ref 6.5–8.1)

## 2022-01-06 LAB — URINALYSIS, ROUTINE W REFLEX MICROSCOPIC
Bilirubin Urine: NEGATIVE
Glucose, UA: NEGATIVE mg/dL
Ketones, ur: NEGATIVE mg/dL
Leukocytes,Ua: NEGATIVE
Nitrite: NEGATIVE
Protein, ur: NEGATIVE mg/dL
Specific Gravity, Urine: 1.011 (ref 1.005–1.030)
pH: 5.5 (ref 5.0–8.0)

## 2022-01-06 LAB — LACTIC ACID, PLASMA: Lactic Acid, Venous: 0.7 mmol/L (ref 0.5–1.9)

## 2022-01-06 MED ORDER — AMOXICILLIN-POT CLAVULANATE 875-125 MG PO TABS
1.0000 | ORAL_TABLET | Freq: Once | ORAL | Status: AC
  Administered 2022-01-06: 1 via ORAL
  Filled 2022-01-06: qty 1

## 2022-01-06 MED ORDER — IOHEXOL 300 MG/ML  SOLN
100.0000 mL | Freq: Once | INTRAMUSCULAR | Status: AC | PRN
  Administered 2022-01-06: 60 mL via INTRAVENOUS

## 2022-01-06 MED ORDER — AMOXICILLIN-POT CLAVULANATE 875-125 MG PO TABS: 1.0000 | ORAL_TABLET | Freq: Two times a day (BID) | ORAL | 0 refills | Status: DC

## 2022-01-06 NOTE — ED Provider Notes (Signed)
Care of the patient assumed at the change of shift. Here for abdominal pain and fever. Seen by GYN earlier today and had neg [place]. Started on Keflex for possible UTI. Pending labs and CT.  ?Physical Exam  ?BP 94/63   Pulse 75   Temp 98.8 ?F (37.1 ?C) (Oral)   Resp 17   Ht '5\' 4"'$  (1.626 m)   Wt 65.8 kg   SpO2 93%   BMI 24.89 kg/m?  ? ?Physical Exam ? ?Procedures  ?Procedures ? ?ED Course / MDM  ? ?Clinical Course as of 01/06/22 0206  ?Tue Jan 06, 2022  ?0006 CBC with elevated WBC, CMP with CKD at baseline. UA is neg for infection. Lactic acid is normal. Awaiting CT.  [CS]  ?0017 Urinalysis, Routine w reflex microscopic(!) [AH]  ?0017 Comprehensive metabolic panel(!) [AH]  ?0017 Creatinine(!): 1.29 ?Creatinine at baseline [AH]  ?0017 CBC with Differential(!) [AH]  ?0017 WBC(!): 13.8 ?White blood cell count about 13.8 [AH]  ?0203 I personally viewed the images from radiology studies and agree with radiologist interpretation: CT shows uncomplicated diverticulitis. She reports good pain control. Will plan d/c with Rx for Augmentin and PCP follow up. RTED for any worsening or worrisome symptoms.  ? [CS]  ?  ?Clinical Course User Index ?[AH] Margarita Mail, PA-C ?[CS] Truddie Hidden, MD  ? ?Medical Decision Making ?Problems Addressed: ?Diverticulitis of large intestine without perforation or abscess without bleeding: acute illness or injury ? ?Amount and/or Complexity of Data Reviewed ?Labs: ordered. Decision-making details documented in ED Course. ?Radiology: ordered and independent interpretation performed. Decision-making details documented in ED Course. ? ?Risk ?OTC drugs. ?Prescription drug management. ? ? ? ? ? ? ? ?  ?Truddie Hidden, MD ?01/06/22 0206 ? ?

## 2022-01-06 NOTE — ED Notes (Signed)
Patient transported to CT 

## 2022-01-07 LAB — URINE CULTURE: Culture: NO GROWTH

## 2022-01-11 LAB — CULTURE, BLOOD (ROUTINE X 2)
Culture: NO GROWTH
Culture: NO GROWTH
Special Requests: ADEQUATE

## 2022-04-21 ENCOUNTER — Other Ambulatory Visit: Payer: Self-pay | Admitting: Internal Medicine

## 2022-04-21 DIAGNOSIS — R911 Solitary pulmonary nodule: Secondary | ICD-10-CM

## 2022-05-06 NOTE — Progress Notes (Signed)
I, Peterson Lombard, LAT, ATC acting as a scribe for Lynne Leader, MD.  Subjective:    CC: R arm and neck pain  HPI: Pt is a 61 y/o female c/o R arm and neck pain that has worsened over the last 4 months. Pt locates pain to bilat side of her neck, R>L, w/ radiating pain into the R upper arm. Of note, pt has a hx of kidney cancer and had a kidney remove 2021. Pt was seen back in 2018 for this issue by Dr. Lorin Mercy.  Radiates: yes UE Numbness/tingling: no UE Weakness:yes Aggravates: worse trying to sleep at night Treatments tried: prior PT @ [facility], change pillows, IBU, Celebrex  Dx imaging: 8/212018 C-spine XR  Pertinent review of Systems: No fevers or chills  Relevant historical information: Kidney cancer status post nephrectomy.   Objective:    Vitals:   05/07/22 0757  BP: 96/68  Pulse: 85  SpO2: 98%   General: Well Developed, well nourished, and in no acute distress.   MSK: C-spine: Normal appearing Nontender midline. Range of motion limited to lateral flexion with pain. Mildly positive Spurling's test. Extremity strength intact and equal bilaterally. Upper reflexes are intact.  Right shoulder: Normal-appearing Normal motion. Intact strength. Negative impingement testing.  Lab and Radiology Results  X-ray images C-spine obtained today personally and independently interpreted Multilevel DDD worse at C5-6 C6-7.  No acute fractures.  No aggressive appearing bony lesions. Await formal radiology review   Impression and Recommendations:    Assessment and Plan: 61 y.o. female with bilateral lateral neck pain and right upper arm pain.  Pain thought to be due to cervical paraspinal and trapezius neck muscle spasm, DJD/cervical spine degenerative changes, and likely some cervical radiculopathy.  Dermatomal pattern is a bit unclear at this time but likely C5 or C6 maybe C7.  Plan for physical therapy.  She has an established relationship and [facility].  Additionally  will prescribe limited gabapentin and Flexeril.  Backup prednisone can be prescribed with a phone call if needed.  Check back in 6 weeks.  If not improving consider MRI.  PDMP not reviewed this encounter. Orders Placed This Encounter  Procedures   DG Cervical Spine 2 or 3 views    Standing Status:   Future    Number of Occurrences:   1    Standing Expiration Date:   05/08/2023    Order Specific Question:   Reason for Exam (SYMPTOM  OR DIAGNOSIS REQUIRED)    Answer:   neck pain    Order Specific Question:   Preferred imaging location?    Answer:   [facility]   Ambulatory referral to Physical Therapy    Referral Priority:   Routine    Referral Type:   Physical Medicine    Referral Reason:   Specialty Services Required    Requested Specialty:   Physical Therapy    Number of Visits Requested:   1   Meds ordered this encounter  Medications   cyclobenzaprine (FLEXERIL) 5 MG tablet    Sig: Take 1-2 tablets (5-10 mg total) by mouth at bedtime as needed for muscle spasms.    Dispense:  60 tablet    Refill:  0   gabapentin (NEURONTIN) 100 MG capsule    Sig: Take 1-3 capsules (100-300 mg total) by mouth 3 (three) times daily as needed (nerve pain).    Dispense:  90 capsule    Refill:  3    Discussed warning signs or symptoms.  Please see discharge instructions. Patient expresses understanding.   The above documentation has been reviewed and is accurate and complete Lynne Leader, M.D.

## 2022-05-07 ENCOUNTER — Ambulatory Visit (INDEPENDENT_AMBULATORY_CARE_PROVIDER_SITE_OTHER): Payer: 59 | Admitting: Family Medicine

## 2022-05-07 ENCOUNTER — Ambulatory Visit (INDEPENDENT_AMBULATORY_CARE_PROVIDER_SITE_OTHER): Payer: 59

## 2022-05-07 VITALS — BP 96/68 | HR 85 | Ht 64.0 in | Wt 151.4 lb

## 2022-05-07 DIAGNOSIS — M5412 Radiculopathy, cervical region: Secondary | ICD-10-CM | POA: Diagnosis not present

## 2022-05-07 DIAGNOSIS — M542 Cervicalgia: Secondary | ICD-10-CM | POA: Diagnosis not present

## 2022-05-07 MED ORDER — GABAPENTIN 100 MG PO CAPS: 100.0000 mg | ORAL_CAPSULE | Freq: Three times a day (TID) | ORAL | 3 refills | Status: DC | PRN

## 2022-05-07 MED ORDER — CYCLOBENZAPRINE HCL 5 MG PO TABS
5.0000 mg | ORAL_TABLET | Freq: Every evening | ORAL | 0 refills | Status: DC | PRN
Start: 2022-05-07 — End: 2022-09-09

## 2022-05-07 NOTE — Patient Instructions (Addendum)
Thank you for coming in today.   Please get an Xray today before you leave   I've referred you to [facility] to see Roxy Manns.  Let us know if you don't hear from them in one week.   I've sent a prescription for Gabapentin and Flexeril to your pharmacy.   Check back in 6 weeks.

## 2022-05-11 NOTE — Progress Notes (Signed)
Cervical spine x-ray shows some arthritis changes.

## 2022-06-04 ENCOUNTER — Ambulatory Visit
Admission: RE | Admit: 2022-06-04 | Discharge: 2022-06-04 | Disposition: A | Discharge status: Home / Self Care | Payer: 59 | Source: Ambulatory Visit | Attending: Internal Medicine | Admitting: Internal Medicine

## 2022-06-04 DIAGNOSIS — R911 Solitary pulmonary nodule: Secondary | ICD-10-CM

## 2022-06-16 NOTE — Progress Notes (Unsigned)
   I, Peterson Lombard, LAT, ATC acting as a scribe for Lynne Leader, MD.  Sherry Fernandez is a 61 y.o. female who presents to [place] at [facility] today for 6-week follow-up for neck and right arm pain.  Patient was last seen by Dr. Georgina Snell on 05/07/2022 and was referred to [facility] and prescribed gabapentin and Flexeril.  Today, patient reports  Dx imaging: 05/07/2022 C-spine x-ray 8/212018 C-spine XR  Pertinent review of systems: ***  Relevant historical information: ***   Exam:  There were no vitals taken for this visit. General: Well Developed, well nourished, and in no acute distress.   MSK: ***    Lab and Radiology Results No results found for this or any previous visit (from the past 72 hour(s)). No results found.     Assessment and Plan: 61 y.o. female with ***   PDMP not reviewed this encounter. No orders of the defined types were placed in this encounter.  No orders of the defined types were placed in this encounter.    Discussed warning signs or symptoms. Please see discharge instructions. Patient expresses understanding.   ***

## 2022-06-17 ENCOUNTER — Ambulatory Visit (INDEPENDENT_AMBULATORY_CARE_PROVIDER_SITE_OTHER): Payer: 59 | Admitting: Family Medicine

## 2022-06-17 VITALS — BP 110/74 | HR 79 | Ht 64.0 in | Wt 152.0 lb

## 2022-06-17 DIAGNOSIS — M542 Cervicalgia: Secondary | ICD-10-CM | POA: Diagnosis not present

## 2022-06-17 NOTE — Patient Instructions (Signed)
Thank you for coming in today.   Glad you are feeling better!  Continue physical therapy and home exercises.  Check back as needed

## 2022-07-09 ENCOUNTER — Encounter: Payer: Self-pay | Admitting: Cardiovascular Disease

## 2022-07-09 ENCOUNTER — Ambulatory Visit: Payer: 59 | Attending: Cardiovascular Disease | Admitting: Cardiovascular Disease

## 2022-07-09 VITALS — BP 104/66 | HR 83 | Ht 64.0 in | Wt 150.2 lb

## 2022-07-09 DIAGNOSIS — G720 Drug-induced myopathy: Secondary | ICD-10-CM | POA: Diagnosis not present

## 2022-07-09 DIAGNOSIS — I10 Essential (primary) hypertension: Secondary | ICD-10-CM

## 2022-07-09 DIAGNOSIS — T466X5A Adverse effect of antihyperlipidemic and antiarteriosclerotic drugs, initial encounter: Secondary | ICD-10-CM

## 2022-07-09 DIAGNOSIS — I700 Atherosclerosis of aorta: Secondary | ICD-10-CM

## 2022-07-09 DIAGNOSIS — N1831 Chronic kidney disease, stage 3a: Secondary | ICD-10-CM | POA: Diagnosis not present

## 2022-07-09 DIAGNOSIS — E7800 Pure hypercholesterolemia, unspecified: Secondary | ICD-10-CM | POA: Diagnosis not present

## 2022-07-09 NOTE — Patient Instructions (Signed)
Medication Instructions:  The current medical regimen is effective;  continue present plan and medications.  *If you need a refill on your cardiac medications before your next appointment, please call your pharmacy*   Follow-Up: At [facility], you and your health needs are our priority.  As part of our continuing mission to provide you with exceptional heart care, we have created designated Provider Care Teams.  These Care Teams include your primary Cardiologist (physician) and Advanced Practice Providers (APPs -  Physician Assistants and Nurse Practitioners) who all work together to provide you with the care you need, when you need it.  We recommend signing up for the patient portal called "MyChart".  Sign up information is provided on this After Visit Summary.  MyChart is used to connect with patients for Virtual Visits (Telemedicine).  Patients are able to view lab/test results, encounter notes, upcoming appointments, etc.  Non-urgent messages can be sent to your provider as well.   To learn more about what you can do with MyChart, go to [email or web address].    Your next appointment:   As needed  The format for your next appointment:   In Person  Provider:   Sanda Klein, MD

## 2022-07-09 NOTE — Progress Notes (Signed)
Cardiology Office Note    Date:  07/12/2022   ID:  Sherry Fernandez, DOB 26-Jun-1961, MRN [id number]  PCP:  Deland Pretty, MD  Cardiologist:   Sanda Klein, MD   Chief Complaint  Patient presents with   Hyperlipidemia    History of Present Illness:  Sherry Fernandez is a 61 y.o. female with Hypercholesterolemia, migraine headaches.  She had elevated blood pressure that resolved completely after left nephrectomy for renal cell carcinoma (2021).  Had an episode of sigmoid diverticulitis treated in the emergency room in May, but otherwise has had a very good year.  Her blood pressure has been consistently normal without any medications or since her nephrectomy.  She was referred back by her primary care provider due to concerns about her untreated hypercholesterolemia.  Her most recent lipid profile performed on April 15, 2022 shows total cholesterol 213, HDL 43, LDL 143, triglycerides 137.  Her creatinine was slightly high at 1.3 but potassium was normal at 4.1.  TSH was normal at 2.170.  Liver function tests were normal.  She tried taking multiple statins in the past and had to stop due to musculoskeletal complaints.  We also tried treatment with Repatha but after about 3 injections this caused similar joint pain in her fingers her back and her legs.  The symptoms resolved once she stopped the Repatha.  She has a history of ankylosing spondylitis that was suppressed with Humira for many years.  This was stopped when she developed the renal malignancy.  She remains very physically active, taking care of 2 young grandchildren, hiking in the [place] area.  The patient specifically denies any chest pain at rest exertion, dyspnea at rest or with exertion, orthopnea, paroxysmal nocturnal dyspnea, syncope, palpitations, focal neurological deficits, intermittent claudication, lower extremity edema, unexplained weight gain, cough, hemoptysis or wheezing.  Echocardiogram in 2018 showed  borderline LVH (septum 13 mm, posterior wall 11 mm) and grade 1 diastolic dysfunction, but normal left ventricle systolic function and normal size left atrium. She did not have renal artery stenosis by Doppler ultrasound.  Coronary CT angiogram in 2020 showed a calcium score of 0, although there was a report of "mild atherosclerosis of the proximal RCA with noncalcified plaque".    There is no family history of premature CAD, although her father died at age 39 of nonischemic cardiomyopathy, while waiting for a transplant. Clinical CHF had started about age 45. Her paternal grandmother died suddenly at age 6, without known previous cardiac illness. Her son has pectus carinatum.  Past Medical History:  Diagnosis Date   Arthritis    Cancer ([place])    lt. kidney   FH: CAD (coronary artery disease)    HTN (hypertension)    Hypercholesterolemia    Migraine    frequent    Past Surgical History:  Procedure Laterality Date   bladder  N/A    CHOLECYSTECTOMY N/A 11/20/2019   Procedure: LAPAROSCOPIC CHOLECYSTECTOMY WITH INTRAOPERATIVE CHOLANGIOGRAM;  Surgeon: Armandina Gemma, MD;  Location: [facility];  Service: General;  Laterality: N/A;   HERNIA REPAIR     REFRACTIVE SURGERY Bilateral    right knee arthrocentesis     ROBOT ASSISTED LAPAROSCOPIC NEPHRECTOMY Left 10/04/2019   Procedure: XI ROBOTIC ASSISTED LAPAROSCOPIC NEPHRECTOMY;  Surgeon: Alexis Frock, MD;  Location: [facility];  Service: Urology;  Laterality: Left;  3 HRS   thyroid nodule biopsy      Current Medications: Outpatient Medications Prior to Visit  Medication Sig Dispense Refill  cyclobenzaprine (FLEXERIL) 5 MG tablet Take 1-2 tablets (5-10 mg total) by mouth at bedtime as needed for muscle spasms. (Patient not taking: Reported on 07/09/2022) 60 tablet 0   DEXILANT 60 MG capsule Take 60 mg by mouth daily before breakfast. (Patient not taking: Reported on 07/09/2022)     docusate sodium (COLACE) 100 MG capsule Take 100 mg by mouth 2 (two)  times daily as needed for mild constipation. (Patient not taking: Reported on 07/09/2022)     famotidine (PEPCID) 40 MG tablet Take 40 mg by mouth at bedtime as needed for heartburn or indigestion. (Patient not taking: Reported on 07/09/2022)     gabapentin (NEURONTIN) 100 MG capsule Take 1-3 capsules (100-300 mg total) by mouth 3 (three) times daily as needed (nerve pain). (Patient not taking: Reported on 07/09/2022) 90 capsule 3   Fremanezumab-vfrm (AJOVY) 225 MG/1.5ML SOSY Inject 1.5 mLs into the skin every 30 (thirty) days. migrane prevention     psyllium (METAMUCIL SMOOTH TEXTURE) 28 % packet Take 1 packet by mouth 2 (two) times daily as needed (regularity).     rizatriptan (MAXALT) 10 MG tablet Take 10 mg by mouth every 2 (two) hours as needed for migraine. May repeat in 2 hours if needed  (Patient not taking: Reported on 07/09/2022)     sennosides-docusate sodium (SENOKOT-S) 8.6-50 MG tablet Take 1 tablet by mouth daily as needed for constipation.     No facility-administered medications prior to visit.     Allergies:   Sulfamethoxazole   Social History   Socioeconomic History   Marital status: Married    Spouse name: Not on file   Number of children: Not on file   Years of education: Not on file   Highest education level: Not on file  Occupational History   Not on file  Tobacco Use   Smoking status: Never   Smokeless tobacco: Never  Vaping Use   Vaping Use: Never used  Substance and Sexual Activity   Alcohol use: Yes    Comment: socially   Drug use: No   Sexual activity: Not on file  Other Topics Concern   Not on file  Social History Narrative   Not on file   Social Determinants of Health   Financial Resource Strain: Not on file  Food Insecurity: Not on file  Transportation Needs: Not on file  Physical Activity: Not on file  Stress: Not on file  Social Connections: Not on file     Family History:  The patient's family history includes CAD in her daughter and  sister; Cardiomyopathy in her daughter; Heart failure (age of onset: 79) in her father; Sudden death in her paternal grandmother.   ROS:   Please see the history of present illness.    ROS All other systems reviewed and are negative.   PHYSICAL EXAM:   VS:  BP 104/66 (BP Location: Left Arm, Patient Position: Sitting, Cuff Size: Normal)   Pulse 83   Ht '5\' 4"'$  (1.626 m)   Wt 68.1 kg   SpO2 95%   BMI 25.78 kg/m    GEN: Well nourished, well developed, in no acute distress  HEENT: normal  Neck: no JVD, carotid bruits, or masses Cardiac: RRR; no murmurs, rubs, or gallops,no edema  Respiratory:  clear to auscultation bilaterally, normal work of breathing GI: soft, nontender, nondistended, + BS MS: no deformity or atrophy  Skin: warm and dry, no rash Neuro:  Alert and Oriented x 3, Strength and sensation are intact Psych:  euthymic mood, full affect  Wt Readings from Last 3 Encounters:  07/09/22 68.1 kg  06/17/22 68.9 kg  05/07/22 68.7 kg      Studies/Labs Reviewed:   EKG:  EKG is ordered today.  It shows normal sinus rhythm, nonspecific T wave changes, QTc 439 ms.  Recent Labs: 01/05/2022: ALT 9; BUN 20; Creatinine, Ser 1.29; Hemoglobin 13.4; Platelets 219; Potassium 3.9; Sodium 136   Lipid Panel    Component Value Date/Time   CHOL 222 (HH) 06/23/2007 0855   TRIG 119 06/23/2007 0855   HDL 29.1 (L) 06/23/2007 0855   CHOLHDL 7.6 CALC 06/23/2007 0855   VLDL 24 06/23/2007 0855   LDLDIRECT 180.5 06/23/2007 0855   April 15, 2022  total cholesterol 213, HDL 43, LDL 143, triglycerides 137 creatinine  1.3, potassium  4.1,  TSH  2.170.  Liver function tests were normal.  ASSESSMENT:    1. Aortic atherosclerosis ([place])   2. Hypercholesterolemia   3. Statin myopathy   4. Stage 3a chronic kidney disease ([place])      PLAN:  In order of problems listed above:  Aortic atherosclerosis: Reviewed her most recent CT.  She does indeed have some calcified plaque in the mid arch as well  as in the abdominal aorta and some plaque in the right renal artery.  She does not have any clinically significant PVD and did not have anything more than a minor plaque in the RCA on previous coronary CT angiography.   HLP: With diet and exercise, Barbera Setters has been able to reduce her LDL from about 180 to 143.  There's also been a significant improvement in her HDL cholesterol from 35 to 43.  She has lost weight and is exercising.  Discussed healthy diet to improve her LDL cholesterol further.  She has tried treatment with several statins as well as with Repatha and has had intolerable musculoskeletal side effects.  She does not have clinically significant CAD or PAD, but does have some evidence of aortic atherosclerosis on imaging studies. Statin myopathy: Intolerant to numerous statins and also to Repatha due to musculoskeletal side effects.  Options for treatment include Nexletol and Leqvio, but understandably Judeen Hammans is reluctant to start any additional medications.  She would like to proceed with diet and exercise.  Consider periodic monitoring with treadmill stress testing and coronary calcium score. HTN: Resolved following nephrectomy for left renal cell carcinoma.  No evidence of disease on CT abdomen and pelvis performed in May 2023. History of ankylosing spondylitis: Humira discontinued after she developed renal cell carcinoma.   FHx of heart disease:  It sounds like her dad had nonischemic CMP and her mother had sudden (arrhythmic?) death. The patient has no structural heart disease by echo. CKD 3a: Minor elevation in creatinine following unilateral nephrectomy.  Unlikely to be a progressive problem.  Creatinine stable at 1.3, GFR 45-50.   Medication Adjustments/Labs and Tests Ordered: Current medicines are reviewed at length with the patient today.  Concerns regarding medicines are outlined above.  Medication changes, Labs and Tests ordered today are listed in the Patient Instructions  below. Patient Instructions  Medication Instructions:  The current medical regimen is effective;  continue present plan and medications.  *If you need a refill on your cardiac medications before your next appointment, please call your pharmacy*   Follow-Up: At [facility], you and your health needs are our priority.  As part of our continuing mission to provide you with exceptional heart care, we have created designated  Provider Care Teams.  These Care Teams include your primary Cardiologist (physician) and Advanced Practice Providers (APPs -  Physician Assistants and Nurse Practitioners) who all work together to provide you with the care you need, when you need it.  We recommend signing up for the patient portal called "MyChart".  Sign up information is provided on this After Visit Summary.  MyChart is used to connect with patients for Virtual Visits (Telemedicine).  Patients are able to view lab/test results, encounter notes, upcoming appointments, etc.  Non-urgent messages can be sent to your provider as well.   To learn more about what you can do with MyChart, go to [email or web address].    Your next appointment:   As needed  The format for your next appointment:   In Person  Provider:   Sanda Klein, MD           Signed, Sanda Klein, MD  07/12/2022 5:47 PM    [address], [place], [place]  [address] Phone: [phone]; Fax: [phone]

## 2022-07-12 ENCOUNTER — Encounter: Payer: Self-pay | Admitting: Cardiovascular Disease

## 2022-07-30 ENCOUNTER — Ambulatory Visit: Payer: 59 | Admitting: Cardiovascular Disease

## 2022-09-08 NOTE — Progress Notes (Signed)
   I, Peterson Lombard, LAT, ATC acting as a scribe for Lynne Leader, MD.  Sherry Fernandez is a 62 y.o. female who presents to [place] at [facility] today for right shoulder pain.  Patient was previously seen by Dr. Georgina Snell on 06/17/2022 for neck and right arm pain.  Patient completed prior course of PT at [facility].  Today, patient reports?  Patient locates pain to?  Dx imaging: 05/07/2022 C-spine x-ray 8/212018 C-spine XR  Pertinent review of systems: ***  Relevant historical information: ***   Exam:  There were no vitals taken for this visit. General: Well Developed, well nourished, and in no acute distress.   MSK: ***    Lab and Radiology Results No results found for this or any previous visit (from the past 72 hour(s)). No results found.     Assessment and Plan: 62 y.o. female with ***   PDMP not reviewed this encounter. No orders of the defined types were placed in this encounter.  No orders of the defined types were placed in this encounter.    Discussed warning signs or symptoms. Please see discharge instructions. Patient expresses understanding.   ***

## 2022-09-09 ENCOUNTER — Ambulatory Visit: Payer: Self-pay

## 2022-09-09 ENCOUNTER — Ambulatory Visit (INDEPENDENT_AMBULATORY_CARE_PROVIDER_SITE_OTHER): Payer: 59 | Admitting: Family Medicine

## 2022-09-09 ENCOUNTER — Ambulatory Visit (INDEPENDENT_AMBULATORY_CARE_PROVIDER_SITE_OTHER): Payer: 59

## 2022-09-09 VITALS — BP 128/84 | HR 81 | Ht 64.0 in | Wt 152.0 lb

## 2022-09-09 DIAGNOSIS — G8929 Other chronic pain: Secondary | ICD-10-CM

## 2022-09-09 DIAGNOSIS — M25511 Pain in right shoulder: Secondary | ICD-10-CM

## 2022-09-09 DIAGNOSIS — M542 Cervicalgia: Secondary | ICD-10-CM | POA: Diagnosis not present

## 2022-09-09 MED ORDER — TIZANIDINE HCL 2 MG PO TABS: 2.0000 mg | ORAL_TABLET | Freq: Three times a day (TID) | ORAL | 1 refills | Status: DC | PRN

## 2022-09-09 NOTE — Patient Instructions (Addendum)
Thank you for coming in today.   Please get an Xray today before you leave   You should hear from MRI scheduling within 1 week. If you do not hear please let me know.    Try the tizinadine at bedtime.   Recheck following the MRIs

## 2022-09-10 NOTE — Progress Notes (Signed)
Right shoulder x-ray shows some mild arthritis

## 2022-09-16 ENCOUNTER — Encounter: Payer: Self-pay | Admitting: Neurology

## 2022-09-16 ENCOUNTER — Ambulatory Visit (INDEPENDENT_AMBULATORY_CARE_PROVIDER_SITE_OTHER): Payer: 59 | Admitting: Neurology

## 2022-09-16 VITALS — BP 119/74 | HR 67 | Ht 64.0 in | Wt 149.4 lb

## 2022-09-16 DIAGNOSIS — G43709 Chronic migraine without aura, not intractable, without status migrainosus: Secondary | ICD-10-CM | POA: Diagnosis not present

## 2022-09-16 DIAGNOSIS — R202 Paresthesia of skin: Secondary | ICD-10-CM

## 2022-09-16 DIAGNOSIS — M4802 Spinal stenosis, cervical region: Secondary | ICD-10-CM

## 2022-09-16 DIAGNOSIS — R29898 Other symptoms and signs involving the musculoskeletal system: Secondary | ICD-10-CM

## 2022-09-16 DIAGNOSIS — M5412 Radiculopathy, cervical region: Secondary | ICD-10-CM

## 2022-09-16 DIAGNOSIS — M79601 Pain in right arm: Secondary | ICD-10-CM | POA: Diagnosis not present

## 2022-09-16 DIAGNOSIS — W19XXXA Unspecified fall, initial encounter: Secondary | ICD-10-CM

## 2022-09-16 DIAGNOSIS — G992 Myelopathy in diseases classified elsewhere: Secondary | ICD-10-CM

## 2022-09-16 DIAGNOSIS — M6289 Other specified disorders of muscle: Secondary | ICD-10-CM

## 2022-09-16 DIAGNOSIS — G5601 Carpal tunnel syndrome, right upper limb: Secondary | ICD-10-CM

## 2022-09-16 DIAGNOSIS — R292 Abnormal reflex: Secondary | ICD-10-CM

## 2022-09-16 DIAGNOSIS — R200 Anesthesia of skin: Secondary | ICD-10-CM

## 2022-09-16 NOTE — Progress Notes (Signed)
[facility]    Provider:  Dr Jaynee Eagles Requesting Provider: Deland Pretty, MD Primary Care Provider:  Deland Pretty, MD  CC:  migraine  HPI:  Sherry Fernandez is a 62 y.o. female here as requested by Deland Pretty, MD for migraines. PMHx migraines. She has terrible neck pain, been to PT for months, failed conservative therapy. She has neck pain. She has pain radiating down the right arm. Migraines started in her 2nd pregnancy in 1993. Daughter has migraines. No aura. Since then she gets violently sick, nausea, vomiting, pulsating/pounding/throbbing, photo/phonophobia, hurts to move, a dark room helps, quiet room, sleep helps, associated severe neck tightness and pain, usually starts on the right behind the eye and radiates to the whole head and neck, she has had one migraine last 21 days 16 years ago, now last 24-72 hours, ubrelvy used to help but not doing much anymore, nurtec has helped. She is having 15 migraine days a month, no other headaches. Moderate to severe. Alcohol is a huge trigger. No medication overuse. She has weakness in her right arm and also she is imbalanced. Ongoing migraines at this severity and frequ for years. She ha been to neurology. Imbalance, falls, ataxia, dragging right foot.   Reviewed notes, labs and imaging from outside physicians, which showed;  MRI brain 2011: Clinical Data: Thunder clap headaches.  Vertigo, diplopia. History  of migraine headaches.    MRI HEAD WITHOUT AND WITH CONTRAST    Technique:  Multiplanar, multiecho pulse sequences of the brain and  surrounding structures were obtained according to standard protocol  without and with intravenous contrast    Contrast: Multihance of 20 ml.    Comparison: None. CT scan of the brain of 05/25/2006.    Findings:    Pearline Cables white matter differentiation is normal.  The sella is  appropriate in signal and morphology for patient's age.    Clival signal is normal.  Cerebellar tonsils are at  the level of  the foramen magnum.    The odontoid process, the predental space and the pre vertebral  soft tissues are within normal limits.    Scattered subcentimeter lymph nodes are seen in the posterior  triangle regions and the superficial cervical chain regions  bilaterally.    No diffusion weighted abnormalities seen.    Axial FLAIR and T2-weighted images demonstrate the  brain signal to  be normal .  Ventricles are normal.  Mastoids are clear.  The  internal auditory canals are symmetrical in signal and morphology.    Flow voids are maintained in the major vessels at the cranial skull  base.    Mild inflammatory thickening  of  the mucosa in  the ethmoid air  cells and the maxillary sinuses  is seen.    Visualized orbital contents are  grossly normal.    No abnormal blood breakdown products seen on SPGR sequences.   No pathological intracranial enhancement noted.  Dural venous  sinuses are widely patent.     IMPRESSION:  1.  No evidence of acute ischemia.  2.  Brain signal normal.  3.  Mild inflammatory mucosal thickening of the paranasal sinuses.   Reviewed prior notes from [facility] and past medications: ANALGESICS: tylenol ANTI-MIGRAINE:Maxalt, naratriptan, imitrex HEART/BP: propranolol, metoprolol DECONGESTANT/ANTIHISTAMINE: ANTI-NAUSEANT: NSAIDS: Advil (RENAL CELL CA), tylenol MUSCLE RELAXANTS: tizanidine ANTI-CONVULSANTS: Topamax (RENAL CELL CA), gabapentin STEROIDS: (RENAL CELL CA) SLEEPING PILLS/TRANQUILIZERS: ANTI-DEPRESSANTS: amitriptyline, nortriptyline HERBAL: FIBROMYALGIA: HORMONAL: OTHER: Ajovy helped tremendously decreased by at least 50% had insurance  problems, Emgality, nurtec, ubrelvy, aimovig contraindicated due to constipation PROCEDURES FOR HEADACHES:   Review of Systems: Patient complains of symptoms per HPI as well as the following symptoms right arm weakness. Pertinent negatives and positives per HPI. All others  negative.   Social History   Socioeconomic History   Marital status: Married    Spouse name: Not on file   Number of children: Not on file   Years of education: Not on file   Highest education level: Not on file  Occupational History   Not on file  Tobacco Use   Smoking status: Never   Smokeless tobacco: Never  Vaping Use   Vaping Use: Never used  Substance and Sexual Activity   Alcohol use: Yes    Comment: socially   Drug use: No   Sexual activity: Not on file  Other Topics Concern   Not on file  Social History Narrative   Not on file   Social Determinants of Health   Financial Resource Strain: Not on file  Food Insecurity: Not on file  Transportation Needs: Not on file  Physical Activity: Not on file  Stress: Not on file  Social Connections: Not on file  Intimate Partner Violence: Not on file    Family History  Problem Relation Age of Onset   Heart failure Father 31   CAD Sister    Sudden death Paternal Grandmother    CAD Daughter        Pt reports that she got 8 stents   Cardiomyopathy Daughter        got an ICD   Migraines Daughter     Past Medical History:  Diagnosis Date   Arthritis    Cancer ([place])    lt. 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   Abnormal ultrasound of gallbladder 11/07/2019   Renal cell carcinoma ([place]) 10/17/2019   Renal mass 10/04/2019   Other spondylosis with radiculopathy, cervical region 05/03/2017   Hypertension 09/25/2016   Family history of cardiomyopathy 09/25/2016   SINUSITIS- ACUTE-NOS 10/28/2007   JOINT EFFUSION, LEFT KNEE 07/05/2007   Chronic migraine without aura without status migrainosus, not intractable 06/30/2007   Hypercholesterolemia 02/25/2007    Past Surgical History:  Procedure Laterality Date   bladder  N/A     CHOLECYSTECTOMY N/A 11/20/2019   Procedure: LAPAROSCOPIC CHOLECYSTECTOMY WITH INTRAOPERATIVE CHOLANGIOGRAM;  Surgeon: Armandina Gemma, MD;  Location: [facility];  Service: General;  Laterality: N/A;   HERNIA REPAIR     REFRACTIVE SURGERY Bilateral    right knee arthrocentesis     ROBOT ASSISTED LAPAROSCOPIC NEPHRECTOMY Left 10/04/2019   Procedure: XI ROBOTIC ASSISTED LAPAROSCOPIC NEPHRECTOMY;  Surgeon: Alexis Frock, MD;  Location: [facility];  Service: Urology;  Laterality: Left;  3 HRS   thyroid nodule biopsy      Current Outpatient Medications  Medication Sig Dispense Refill   DEXILANT 60 MG capsule Take 60 mg by mouth daily before breakfast.     famotidine (PEPCID) 40 MG tablet Take 40 mg by mouth as needed for heartburn or indigestion.     No current facility-administered medications for this visit.    Allergies as of 09/16/2022 - Review Complete 09/16/2022  Allergen Reaction Noted   Sulfamethoxazole Swelling 02/23/2007    Vitals: BP 119/74  Pulse 67   Ht '5\' 4"'$  (1.626 m)   Wt 149 lb 6.4 oz (67.8 kg)   BMI 25.64 kg/m  Last Weight:  Wt Readings from Last 1 Encounters:  09/16/22 149 lb 6.4 oz (67.8 kg)   Last Height:   Ht Readings from Last 1 Encounters:  09/16/22 '5\' 4"'$  (1.626 m)     Physical exam: Exam: Gen: NAD, conversant, well nourised, obese, well groomed                     CV: RRR, no MRG. No Carotid Bruits. No peripheral edema, warm, nontender Eyes: Conjunctivae clear without exudates or hemorrhage  Neuro: Detailed Neurologic Exam  Speech:    Speech is normal; fluent and spontaneous with normal comprehension.  Cognition:    The patient is oriented to person, place, and time;     recent and remote memory intact;     language fluent;     normal attention, concentration,     fund of knowledge Cranial Nerves:    The pupils are equal, round, and reactive to light. The fundi are normal and spontaneous venous pulsations are present. Visual fields are full to  finger confrontation. Extraocular movements are intact. Trigeminal sensation is intact and the muscles of mastication are normal. The face is symmetric. The palate elevates in the midline. Hearing intact. Voice is normal. Shoulder shrug is normal. The tongue has normal motion without fasciculations.   Coordination: nml  Gait: nml  Motor Observation:    No asymmetry, no atrophy, and no involuntary movements noted. Tone:    Normal muscle tone.    Posture:    Posture is normal. normal erect    Strength:right arm: deltoid 3/5, triceps 3/5, weak grip, weak opponens  otherwise strength is V/V in the upper and lower limbs.      Sensation: decreased sensation right axillary area     Reflex Exam:  DTR's:    Deep tendon reflexes in the upper and lower extremities are very brisk bilaterally.   Toes:    The toes are downgoing bilaterally.   Clonus:    2 beats clonus right AJ    Assessment/Plan:  Patient with chronic migraines. Also likely cervical pathology and concomitant CTS  chronic migraine We discussed: Retry Ajovy and then go to [facility] or Botox for migraines. Decided on Botox for migraines  Right hand: CTS, wrist splints at bedtime 4-6 weeks and if it doesn't help we can perform testing (EMG/NCS) and highly recommend treatment Neck: Pinched nerve MRI cervical spine she has weakness, abnormal reflexes, falls, question spinal stenosis and radiculopathy may need surgical intervention. Failed conservative treatment for over a year completed PT > 6 months under the care of doctors for > 1 year need MRI  Orders Placed This Encounter  Procedures   MR CERVICAL SPINE WO CONTRAST   NCV with EMG(electromyography)   No orders of the defined types were placed in this encounter.   Cc: Deland Pretty, MD,  Deland Pretty, MD  Sarina Ill, MD  [facility] [address] [place], [place] [address]  Phone [phone] Fax [phone]  I spent 75  minutes of face-to-face and non-face-to-face time with patient on the  1. Chronic migraine without aura without status migrainosus, not intractable   2. Carpal tunnel syndrome of right wrist   3. Cervical radiculopathy at C5   4. Right arm pain   5. Right arm weakness   6. Proximal weakness of extremity  7. Abnormal DTR (deep tendon reflex)   8. Brisk deep tendon reflexes   9. Numbness and tingling of right arm   10. Fall, initial encounter   11. Stenosis of cervical spine with myelopathy (HCC)    diagnosis.  This included previsit chart review, lab review, study review, order entry, electronic health record documentation, patient education on the different diagnostic and therapeutic options, counseling and coordination of care, risks and benefits of management, compliance, or risk factor reduction

## 2022-09-16 NOTE — Patient Instructions (Addendum)
Suggestion: Retry Ajovy and then go to [facility] or Botox for migraines  Botox for migraines: chronic migraines  Right hand: CTS, wrist splints at bedtime 4-6 weeks and if it doesn't help we can perform testing (EMG/NCS) and highly recommend treatment Neck: Pinched nerve MRI cervical spine  Cervical Radiculopathy  Cervical radiculopathy happens when a nerve in the neck (a cervical nerve) is pinched or bruised. This condition can happen because of an injury to the cervical spine (vertebrae) in the neck, or as part of the normal aging process. Pressure on the cervical nerves can cause pain or numbness that travels from the neck all the way down to the arm and fingers. This condition usually gets better with rest. Treatment may be needed if the condition does not improve. What are the causes? This condition may be caused by: A neck injury. A bulging (herniated) disk. Muscle spasms. Muscle tightness in the neck due to overuse. Arthritis. Breakdown or degeneration in the bones and joints of the spine (spondylosis) due to aging. Bone spurs that may develop near the cervical nerves. What are the signs or symptoms? Symptoms of this condition include: Pain. The pain may travel from the neck to the arm and hand. The pain can be severe or irritating. It may get worse when you move your neck. Numbness or tingling in your arm or hand. Weakness in the affected arm and hand, in severe cases. How is this diagnosed? This condition may be diagnosed based on your symptoms, your medical history, and a physical exam. You may also have tests, including: X-rays. CT scan. MRI. Electromyogram (EMG). Nerve conduction tests. How is this treated? In many cases, treatment is not needed for this condition. With rest, the condition usually gets better over time. If treatment is needed, options may include: Wearing a soft neck collar (cervical collar) for short periods of time. Doing physical therapy to strengthen  your neck muscles. Taking medicines. These may include NSAIDs, such as ibuprofen, or oral corticosteroids. Having spinal injections, in severe cases. Having surgery. This may be needed if other treatments do not help. Different types of surgery may be done depending on the cause of this condition. Follow these instructions at home: If you have a cervical collar: Wear it as told by your health care provider. Remove it only as told by your health care provider. Ask your health care provider if you can remove the cervical collar for cleaning and bathing. If you are allowed to remove the collar for cleaning or bathing: Follow instructions from your health care provider about how to remove the collar safely. Clean the collar by wiping it with mild soap and water and drying it completely. Take out any removable pads in the collar every 1-2 days, and wash them by hand with soap and water. Let them air-dry completely before you put them back in the collar. Check your skin under the collar for irritation or sores. If you see any, tell your health care provider. Managing pain     Take over-the-counter and prescription medicines only as told by your health care provider. If directed, put ice on the affected area. To do this: If you have a soft neck collar, remove it as told by your health care provider. Put ice in a plastic bag. Place a towel between your skin and the bag. Leave the ice on for 20 minutes, 2-3 times a day. Remove the ice if your skin turns bright red. This is very important. If you cannot feel  pain, heat, or cold, you have a greater risk of damage to the area. If applying ice does not help, you can try using heat. Use the heat source that your health care provider recommends, such as a moist heat pack or a heating pad. Place a towel between your skin and the heat source. Leave the heat on for 20-30 minutes. Remove the heat if your skin turns bright red. This is especially important if  you are unable to feel pain, heat, or cold. You have a greater risk of getting burned. Try a gentle neck and shoulder massage to help relieve symptoms. Activity Rest as needed. Return to your normal activities as told by your health care provider. Ask your health care provider what activities are safe for you. Do stretching and strengthening exercises as told by your health care provider or your physical therapist. You may have to avoid lifting. Ask your health care provider how much you can safely lift. General instructions Use a flat pillow when you sleep. Do not drive while wearing a cervical collar. If you do not have a cervical collar, ask your health care provider if it is safe to drive while your neck heals. Ask your health care provider if the medicine prescribed to you requires you to avoid driving or using machinery. Do not use any products that contain nicotine or tobacco. These products include cigarettes, chewing tobacco, and vaping devices, such as e-cigarettes. If you need help quitting, ask your health care provider. Keep all follow-up visits. This is important. Contact a health care provider if: Your condition does not improve with treatment. Get help right away if: Your pain gets much worse and is not controlled with medicines. You have weakness or numbness in your hand, arm, face, or leg. You have a high fever. You have a stiff, rigid neck. You lose control of your bowels or your bladder (have incontinence). You have trouble with walking, balance, or speaking. Summary Cervical radiculopathy happens when a nerve in the neck is pinched or bruised. A nerve can get pinched from a bulging disk, arthritis, muscle spasms, or an injury to the neck. Symptoms include pain, tingling, or numbness radiating from the neck to the arm or hand. Weakness can also occur in severe cases. Treatment may include rest, wearing a cervical collar, and physical therapy. Medicines may be prescribed  to help with pain. In severe cases, injections or surgery may be needed. This information is not intended to replace advice given to you by your health care provider. Make sure you discuss any questions you have with your health care provider. Document Revised: 02/20/2021 Document Reviewed: 02/20/2021 Elsevier Patient Education  [place] Tunnel Syndrome  Carpal tunnel syndrome is a condition that causes pain, numbness, and weakness in your hand and fingers. The carpal tunnel is a narrow area located on the palm side of your wrist. Repeated wrist motion or certain diseases may cause swelling within the tunnel. This swelling pinches the main nerve in the wrist. The main nerve in the wrist is called the median nerve. What are the causes? This condition may be caused by: Repeated and forceful wrist and hand motions. Wrist injuries. Arthritis. A cyst or tumor in the carpal tunnel. Fluid buildup during pregnancy. Use of tools that vibrate. Sometimes the cause of this condition is not known. What increases the risk? The following factors may make you more likely to develop this condition: Having a job that requires you to repeatedly or forcefully  move your wrist or hand or requires you to use tools that vibrate. This may include jobs that involve using computers, working on an [employer], or working with [place] such as Pension scheme manager. Being a woman. Having certain conditions, such as: Diabetes. Obesity. An underactive thyroid (hypothyroidism). Kidney failure. Rheumatoid arthritis. What are the signs or symptoms? Symptoms of this condition include: A tingling feeling in your fingers, especially in your thumb, index, and middle fingers. Tingling or numbness in your hand. An aching feeling in your entire arm, especially when your wrist and elbow are bent for a long time. Wrist pain that goes up your arm to your shoulder. Pain that goes down into your palm or  fingers. A weak feeling in your hands. You may have trouble grabbing and holding items. Your symptoms may feel worse during the night. How is this diagnosed? This condition is diagnosed with a medical history and physical exam. You may also have tests, including: Electromyogram (EMG). This test measures electrical signals sent by your nerves into the muscles. Nerve conduction study. This test measures how well electrical signals pass through your nerves. Imaging tests, such as X-rays, ultrasound, and MRI. These tests check for possible causes of your condition. How is this treated? This condition may be treated with: Lifestyle changes. It is important to stop or change the activity that caused your condition. Doing exercise and activities to strengthen and stretch your muscles and tendons (physical therapy). Making lifestyle changes to help with your condition and learning how to do your daily activities safely (occupational therapy). Medicines for pain and inflammation. This may include medicine that is injected into your wrist. A wrist splint or brace. Surgery. Follow these instructions at home: If you have a splint or brace: Wear the splint or brace as told by your health care provider. Remove it only as told by your health care provider. Loosen the splint or brace if your fingers tingle, become numb, or turn cold and blue. Keep the splint or brace clean. If the splint or brace is not waterproof: Do not let it get wet. Cover it with a watertight covering when you take a bath or shower. Managing pain, stiffness, and swelling If directed, put ice on the painful area. To do this: If you have a removeable splint or brace, remove it as told by your health care provider. Put ice in a plastic bag. Place a towel between your skin and the bag or between the splint or brace and the bag. Leave the ice on for 20 minutes, 2-3 times a day. Do not fall asleep with the cold pack on your skin. Remove  the ice if your skin turns bright red. This is very important. If you cannot feel pain, heat, or cold, you have a greater risk of damage to the area. Move your fingers often to reduce stiffness and swelling. General instructions Take over-the-counter and prescription medicines only as told by your health care provider. Rest your wrist and hand from any activity that may be causing your pain. If your condition is work related, talk with your employer about changes that can be made, such as getting a wrist pad to use while typing. Do any exercises as told by your health care provider, physical therapist, or occupational therapist. Keep all follow-up visits. This is important. Contact a health care provider if: You have new symptoms. Your pain is not controlled with medicines. Your symptoms get worse. Get help right away if: You have severe  numbness or tingling in your wrist or hand. Summary Carpal tunnel syndrome is a condition that causes pain, numbness, and weakness in your hand and fingers. It is usually caused by repeated wrist motions. Lifestyle changes and medicines are used to treat carpal tunnel syndrome. Surgery may be recommended. Follow your health care provider's instructions about wearing a splint, resting from activity, keeping follow-up visits, and calling for help. This information is not intended to replace advice given to you by your health care provider. Make sure you discuss any questions you have with your health care provider. Document Revised: 12/28/2019 Document Reviewed: 12/28/2019 Elsevier Patient Education  [place]. OnabotulinumtoxinA Injection (Medical Use) What is this medication? ONABOTULINUMTOXINA (o na BOTT you lye num tox in eh) treats severe muscle spasms. It may also be used to prevent migraine headaches. It can treat excessive sweating when other medications do not work well enough. This medicine may be used for other purposes; ask your health care  provider or pharmacist if you have questions. COMMON BRAND NAME(S): Botox What should I tell my care team before I take this medication? They need to know if you have any of these conditions: Breathing problems Cerebral palsy spasms Difficulty urinating Heart problems History of surgery where this medication is going to be used Infection at the site where this medication is going to be used Myasthenia gravis or other neurologic disease Nerve or muscle disease Surgery plans Take medications that treat or prevent blood clots Thyroid problems An unusual or allergic reaction to botulinum toxin, albumin, other medications, foods, dyes, or preservatives Pregnant or trying to get pregnant Breast-feeding How should I use this medication? This medication is for injected into a muscle. It is given by your care team in a hospital or clinic setting. A special MedGuide will be given to you before each treatment. Be sure to read this information carefully each time. Talk to your care team about the use of this medication in children. While this medication may be prescribed for children as young as 2 years for selected conditions, precautions do apply. Overdosage: If you think you have taken too much of this medicine contact a poison control center or emergency room at once. NOTE: This medicine is only for you. Do not share this medicine with others. What if I miss a dose? This does not apply. What may interact with this medication? Aminoglycoside antibiotics, such as gentamicin, neomycin, tobramycin Muscle relaxants Other botulinum toxin injections This list may not describe all possible interactions. Give your health care provider a list of all the medicines, herbs, non-prescription drugs, or dietary supplements you use. Also tell them if you smoke, drink alcohol, or use illegal drugs. Some items may interact with your medicine. What should I watch for while using this medication? Visit your care  team for regular check ups. This medication will cause weakness in the muscle where it is injected. Tell your care team if you feel unusually weak in other muscles. Get medical help right away if you have problems with breathing, swallowing, or talking. This medication might make your eyelids droop or make you see blurry or double. If you have weak muscles or trouble seeing do not drive a car, use machinery, or do other dangerous activities. This medication contains albumin from human blood. It may be possible to pass an infection in this medication, but no cases have been reported. Talk to your care team about the risks and benefits of this medication. If your activities have been  limited by your condition, go back to your regular routine slowly after treatment with this medication. What side effects may I notice from receiving this medication? Side effects that you should report to your care team as soon as possible: Allergic reactions--skin rash, itching, hives, swelling of the face, lips, tongue, or throat Dryness or irritation of the eyes, eye pain, change in vision, sensitivity to light Infection--fever, chills, cough, sore throat, wounds that don't heal, pain or trouble when passing urine, general feeling of discomfort or being unwell Spread of botulinum toxin effects--unusual weakness or fatigue, blurry or double vision, trouble swallowing, hoarseness or trouble speaking, trouble breathing, loss of bladder control Trouble passing urine Side effects that usually do not require medical attention (report these to your care team if they continue or are bothersome): Dry mouth Eyelid drooping Fatigue Headache Pain, redness, or irritation at injection site This list may not describe all possible side effects. Call your doctor for medical advice about side effects. You may report side effects to FDA at 1-800-FDA-1088. Where should I keep my medication? This medication is given in a hospital or  clinic and will not be stored at home. NOTE: This sheet is a summary. It may not cover all possible information. If you have questions about this medicine, talk to your doctor, pharmacist, or health care provider.  2023 Elsevier/Gold Standard (2021-06-30 00:00:00)  Electromyoneurogram Electromyoneurogram is a test to check how well your muscles and nerves are working. This procedure includes the combined use of electromyogram (EMG) and nerve conduction study (NCS). EMG is used to evaluate muscles and the nerves that control those muscles. NCS, which is also called electroneurogram, measures how well your nerves conduct electricity. The procedures should be done together to check if your muscles and nerves are healthy. If the results of the tests are abnormal, this may indicate disease or injury, such as a neuromuscular disease or peripheral nerve damage. Tell a health care provider about: Any allergies you have. All medicines you are taking, including vitamins, herbs, eye drops, creams, and over-the-counter medicines. Any bleeding problems you have. Any surgeries you have had. Any medical conditions you have. What are the risks? Generally, this is a safe procedure. However, problems may occur, including: Bleeding or bruising. Infection where the electrodes were inserted. What happens before the test? Medicines Take all of your usually prescribed medications before this testing is performed. Do not stop your blood thinners unless advised by your prescribing physician. General instructions Your health care provider may ask you to warm the limb that will be checked with warm water, hot pack, or wrapping the limb in a blanket. Do not use lotions or creams on the same day that you will be having the procedure. What happens during the test? For EMG  Your health care provider will ask you to stay in a position so that the muscle being studied can be accessed. You will be sitting or lying  down. You may be given a medicine to numb the area (local anesthetic) and the skin will be disinfected. A very thin needle that has an electrode will be inserted into your muscle, one muscle at a time. Typically, multiple muscles are evaluated during a single study. Another small electrode will be placed on your skin near the muscle. Your health care provider will ask you to continue to remain still. The electrodes will record the electrical activity of your muscles. You may see this on a monitor or hear it in the room. After your  muscles have been studied at rest, your health care provider will ask you to contract or flex your muscles. The electrodes will record the electrical activity of your muscles. Your health care provider will remove the electrodes and the electrode needle when the procedure is finished. The procedure may vary among health care providers and hospitals. For NCS  An electrode that records your nerve activity (recording electrode) will be placed on your skin by the muscle that is being studied. An electrode that is used as a reference (reference electrode) will be placed near the recording electrode. A paste or gel will be applied to your skin between the recording electrode and the reference electrode. Your nerve will be stimulated with a mild shock. The speed of the nerves and strength of response is recorded by the electrodes. Your health care provider will remove the electrodes and the gel when the procedure is finished. The procedure may vary among health care providers and hospitals. What can I expect after the test? It is up to you to get your test results. Ask your health care provider, or the department that is doing the test, when your results will be ready. Your health care provider may: Give you medicines for any pain. Monitor the insertion sites to make sure that bleeding stops. You should be able to drive yourself to and from the test. Discomfort can persist  for a few hours after the test, but should be better the next day. Contact a health care provider if: You have swelling, redness, or drainage at any of the insertion sites. Summary Electromyoneurogram is a test to check how well your muscles and nerves are working. If the results of the tests are abnormal, this may indicate disease or injury. This is a safe procedure. However, problems may occur, such as bleeding and infection. Your health care provider will do two tests to complete this procedure. One checks your muscles (EMG) and another checks your nerves (NCS). It is up to you to get your test results. Ask your health care provider, or the department that is doing the test, when your results will be ready. This information is not intended to replace advice given to you by your health care provider. Make sure you discuss any questions you have with your health care provider. Document Revised: 04/30/2021 Document Reviewed: 03/30/2021 Elsevier Patient Education  [place].

## 2022-09-17 ENCOUNTER — Telehealth: Payer: Self-pay | Admitting: *Deleted

## 2022-09-17 NOTE — Telephone Encounter (Signed)
Chronic Migraine CPT 64615  Botox J0585 Units:200  G43.709 Chronic Migraine without aura, not intractable, without status migrainous   

## 2022-09-17 NOTE — Telephone Encounter (Signed)
Pt scheduled for botox injection with Dr. Jaynee Eagles for 10/12/22 at 1:00pm

## 2022-09-17 NOTE — Telephone Encounter (Signed)
-----  Message from Melvenia Beam, MD sent at 09/16/2022  6:57 PM EST ----- Regarding: start botox protocol Please start botox approval for chronic migraines she can come to me for her botox thanks

## 2022-09-20 ENCOUNTER — Ambulatory Visit (INDEPENDENT_AMBULATORY_CARE_PROVIDER_SITE_OTHER): Payer: 59

## 2022-09-20 DIAGNOSIS — M25511 Pain in right shoulder: Secondary | ICD-10-CM | POA: Diagnosis not present

## 2022-09-20 DIAGNOSIS — G8929 Other chronic pain: Secondary | ICD-10-CM

## 2022-09-22 ENCOUNTER — Ambulatory Visit (INDEPENDENT_AMBULATORY_CARE_PROVIDER_SITE_OTHER): Payer: 59

## 2022-09-22 ENCOUNTER — Telehealth: Payer: Self-pay | Admitting: Family Medicine

## 2022-09-22 DIAGNOSIS — M6289 Other specified disorders of muscle: Secondary | ICD-10-CM

## 2022-09-22 DIAGNOSIS — W19XXXA Unspecified fall, initial encounter: Secondary | ICD-10-CM

## 2022-09-22 DIAGNOSIS — M79601 Pain in right arm: Secondary | ICD-10-CM

## 2022-09-22 DIAGNOSIS — R29898 Other symptoms and signs involving the musculoskeletal system: Secondary | ICD-10-CM

## 2022-09-22 DIAGNOSIS — R202 Paresthesia of skin: Secondary | ICD-10-CM

## 2022-09-22 DIAGNOSIS — M5412 Radiculopathy, cervical region: Secondary | ICD-10-CM

## 2022-09-22 DIAGNOSIS — M4802 Spinal stenosis, cervical region: Secondary | ICD-10-CM

## 2022-09-22 DIAGNOSIS — M75111 Incomplete rotator cuff tear or rupture of right shoulder, not specified as traumatic: Secondary | ICD-10-CM

## 2022-09-22 DIAGNOSIS — G992 Myelopathy in diseases classified elsewhere: Secondary | ICD-10-CM

## 2022-09-22 DIAGNOSIS — R200 Anesthesia of skin: Secondary | ICD-10-CM

## 2022-09-22 DIAGNOSIS — G8929 Other chronic pain: Secondary | ICD-10-CM

## 2022-09-22 DIAGNOSIS — R292 Abnormal reflex: Secondary | ICD-10-CM

## 2022-09-22 NOTE — Telephone Encounter (Signed)
Pt calling for R shoulder MRI results.

## 2022-09-22 NOTE — Telephone Encounter (Signed)
Called and reached voicemail.  I left a message. I was waiting for the cervical spine MRI results to come back before making a decision about the shoulder MRI.  However based on the extent of the shoulder rotator cuff tear orthopedic surgery referral placed today.

## 2022-09-24 ENCOUNTER — Other Ambulatory Visit: Payer: Self-pay

## 2022-09-24 DIAGNOSIS — M75111 Incomplete rotator cuff tear or rupture of right shoulder, not specified as traumatic: Secondary | ICD-10-CM

## 2022-09-24 NOTE — Progress Notes (Signed)
Your shoulder MRI as noted in the phone call today does show a rotator cuff tear and I have referred you to orthopedic surgery for surgical consultation.  I see the cervical spine MRI results came back showing areas of pinched nerves worse on the left side.  Happy to talk to you about that as well soon as possible.  That could potentially benefit from an injection in your neck which I am happy to arrange for.

## 2022-09-24 NOTE — Telephone Encounter (Signed)
Called and spoke to pt. New referral placed to [facility].

## 2022-09-24 NOTE — Progress Notes (Signed)
Cervical spine MRI shows areas of pinched nerves worse on the left.  This could cause significan left arm pain.  There is also areas where you could have pain in your neck.  I think an injection in your neck would potentially help.  I can arrange for that now.  Additionally checking back to go over the results in full detail would be a good idea.  Would you like me to order the neck injection now?

## 2022-09-28 ENCOUNTER — Telehealth: Payer: Self-pay

## 2022-09-28 ENCOUNTER — Telehealth: Payer: Self-pay | Admitting: Neurology

## 2022-09-28 DIAGNOSIS — G992 Myelopathy in diseases classified elsewhere: Secondary | ICD-10-CM

## 2022-09-28 DIAGNOSIS — M5412 Radiculopathy, cervical region: Secondary | ICD-10-CM

## 2022-09-28 NOTE — Telephone Encounter (Signed)
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 I would recommend injections or a surgical evaluation or both. We cannot provide that but we can refr to [facility] for evaluation thanks.

## 2022-09-28 NOTE — Telephone Encounter (Signed)
I called and spoke with the patient to inform her of the results. She verbalized understanding of the findings and expressed appreciation for the call. She is agreeable to seeing [facility] for evaluation. I have placed the referral.

## 2022-09-28 NOTE — Telephone Encounter (Signed)
Checking on that status of this auth, she is scheduled for 10/12/22.

## 2022-09-28 NOTE — Telephone Encounter (Signed)
Referral for Neurosurgery fax to [facility]. Phone: [phone], Fax: [phone].

## 2022-10-01 NOTE — Telephone Encounter (Signed)
Scheduled for 2/6 at 3:15pm with Dr. Saintclair Halsted

## 2022-10-02 ENCOUNTER — Other Ambulatory Visit (HOSPITAL_COMMUNITY): Payer: Self-pay

## 2022-10-02 NOTE — Telephone Encounter (Signed)
BOTOX ONE-Benefit Verification [id number] Submitted!

## 2022-10-02 NOTE — Telephone Encounter (Signed)
Pharmacy Patient Advocate Encounter   Received notification from [facility] that prior authorization for Botox 200UNIT solution is required/requested.    PA submitted on 10/02/2022 to (ins) [employer] via CoverMyMeds Key [id number] Status is pending

## 2022-10-05 ENCOUNTER — Other Ambulatory Visit (HOSPITAL_COMMUNITY): Payer: Self-pay

## 2022-10-05 NOTE — Telephone Encounter (Signed)
Pharmacy Patient Advocate Encounter  Received notification from [facility] that the request for prior authorization for Botox 200UNIT solution has been denied due to See below.     Please be advised we currently do not have a Pharmacist to review denials, therefore you will need to process appeals accordingly as needed. Thanks for your support at this time.   You may call [phone] or fax : [phone], to appeal.

## 2022-10-05 NOTE — Telephone Encounter (Signed)
Please let patient know her plan does not cover botox. I would consider vyepti next if she is willing to see if we can get it approved.

## 2022-10-05 NOTE — Telephone Encounter (Signed)
Any updates on auth? Pt scheduled for 10/12/22

## 2022-10-05 NOTE — Telephone Encounter (Signed)
Botox was denied.

## 2022-10-06 ENCOUNTER — Telehealth: Payer: Self-pay | Admitting: *Deleted

## 2022-10-06 NOTE — Telephone Encounter (Signed)
Vyepti order, etc being documented in it's own phone note.

## 2022-10-06 NOTE — Telephone Encounter (Signed)
Excellent thank you

## 2022-10-06 NOTE — Telephone Encounter (Signed)
Vyepti 300 mg IV q3 month order form completed and ready for Dr Cathren Laine signature.

## 2022-10-06 NOTE — Telephone Encounter (Signed)
Order signed by Dr Jaynee Eagles and given to [place] w/ [facility].

## 2022-10-06 NOTE — Telephone Encounter (Signed)
Spoke with patient. She understands that insurance doesn't cover Botox and she is amenable to trying Vyepti infusion every 3 months here at our office. Her questions were answered. She understands the insurance auth process will start and then someone will call her to discuss the next steps, finances, and scheduling. She verbalized appreciation for the call. The 2/12 botox appt was canceled. She has an EMG/NCV on 2/22 at 930 AM. Pt verbalized appreciation for the call.   Started order form for Vyepti.

## 2022-10-07 NOTE — Telephone Encounter (Signed)
Thank you, sounds like Dr. Saintclair Halsted is going to see what orthopaedics thinks about her shoulder and see her back in a month. I would follow his instructions including if he thinks she needs injections or other. F/u with Dr. Saintclair Halsted in a month and the ortho sooner.  thanks

## 2022-10-07 NOTE — Telephone Encounter (Signed)
Office note received from Dr Saintclair Halsted and placed on Dr Cathren Laine desk for review.

## 2022-10-12 ENCOUNTER — Ambulatory Visit: Payer: 59 | Admitting: Neurology

## 2022-10-14 ENCOUNTER — Telehealth: Payer: Self-pay | Admitting: *Deleted

## 2022-10-14 ENCOUNTER — Ambulatory Visit: Payer: 59 | Admitting: Neurology

## 2022-10-14 ENCOUNTER — Encounter: Payer: Self-pay | Admitting: Cardiovascular Disease

## 2022-10-14 NOTE — Telephone Encounter (Signed)
Received clearance request from [facility] for Dr Veverly Fells to complete R shoulder scope, A-SAD, open cuff repair, open DCR, possible biceps tenodesis. Date is pending.   Dr Jaynee Eagles signed clearance. Clearance faxed back to [facility] with office note from 09/16/22. Received a receipt of confirmation.

## 2022-10-22 ENCOUNTER — Ambulatory Visit (INDEPENDENT_AMBULATORY_CARE_PROVIDER_SITE_OTHER): Payer: 59 | Admitting: Neurology

## 2022-10-22 DIAGNOSIS — G5601 Carpal tunnel syndrome, right upper limb: Secondary | ICD-10-CM

## 2022-10-22 NOTE — Progress Notes (Signed)
History: Right arm hurts, neck pain and radiation. Right hand falls asleep at night, wrist braces help on the right hand. Left arm largely unaffected. 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 There was no suggestion of cervical radiculopathy but given her right-sided neck pain, right radicular symptoms and MRI cervical spine (reviewed images personally and agreed) she could still have a pinched nerve we did not catch on this test. Sending to Sherry Fernandez in Orthopaedics.  Orders Placed This Encounter  Procedures   Ambulatory referral to Orthopedic Surgery   STUDY DATE: 09/22/22 PATIENT NAME: Sherry Fernandez DOB: May 27, 1961 MRN: [id number]   ORDERING CLINICIAN: Melvenia Beam, MD    EXAM: MR CERVICAL SPINE WO CONTRAST  TECHNIQUE: MRI of the cervical spine was obtained utilizing multiplanar, multiecho pulse sequences. CONTRAST:   COMPARISON: none   IMAGING SITE: [facility] [place] [id number] [phone]     FINDINGS:    On sagittal views the vertebral bodies have normal height and alignment.  The spinal cord is normal in size and appearance. The posterior fossa, pituitary gland and paraspinal soft tissues are unremarkable.     On axial views: C2-3 no spinal stenosis or foraminal narrowing C3-4 disc bulging and uncovertebral joint hypertrophy with severe biforaminal stenosis  C4-5 disc bulging and uncovertebral joint hypertrophy with moderate biforaminal stenosis  C5-6 disc bulging with severe left foraminal stenosis  C6-7 disc bulging with moderate left foraminal stenosis  C7-T1 no spinal stenosis or foraminal narrowing    Limited views of the soft tissues of the head and neck are unremarkable.     IMPRESSION:    MRI cervical spine (without) demonstrating: - At C3-4 disc bulging and  uncovertebral joint hypertrophy with severe biforaminal stenosis. - At C4-5 disc bulging and uncovertebral joint hypertrophy with moderate biforaminal stenosis. - At C5-6 disc bulging with severe left foraminal stenosis. - At C6-7 disc bulging with moderate left foraminal stenosis.   I spent 20 minutes of face-to-face and non-face-to-face time with patient on the  1. Carpal tunnel syndrome of right wrist    diagnosis.  This included previsit chart review, lab review, study review, order entry, electronic health record documentation, patient education on the different diagnostic and therapeutic options, counseling and coordination of care, risks and benefits of management, compliance, or risk factor reduction. This does not include time spent on emg/ncs.

## 2022-10-26 NOTE — Progress Notes (Signed)
Full Name: Sherry Fernandez Gender: Female MRN #: [id number] Date of Birth: March 07, 1961    Visit Date: 10/22/2022 09:27 Age: 62 Years Examining Physician: Dr. Sarina Ill Referring Physician: Dr. Sarina Ill Height: 5 feet 4 inch  History: Right arm hurts, neck pain and radiation. Right hand falls asleep at night, wrist braces help on the right hand. Left arm largely unaffected. [name] Sherry Fernandez  Summary:   Nerve Conduction Studies were performed on the bilateral upper extremities.  The right median APB motor nerve showed prolonged distal onset latency (4.5 ms, N<4.4) and reduced amplitude(3.8 mV, N>4) The right Median 2nd Digit orthodromic sensory nerve showed prolonged distal peak latency (3.5 ms, N<3.4) and reduced amplitude(9 uV, N>10). The right median/ulnar (palm) comparison nerve showed prolonged distal peak latency (Median Palm, 2.6 ms, N<2.2) and abnormal peak latency difference (Median Palm-Ulnar Palm, 0.8 ms, N<0.4) with a relative median delay.  All remaining nerves (as indicated in the following tables) were within normal limits.  [name] showed polyphasic motor units and diminished motor unit recruitment. All remaining muscles (as indicated in the following tables) were within normal limits.     Conclusion:  There is electrophysiologic evidence of moderately-severe right carpal Tunnel Syndrome.  No suggestion of polyneuropathy or cervical radiculopathy.   ------------------------------- Sarina Ill M.D.  [facility] [address], [place], [place] [address] Tel: [phone] Fax: [phone]  Verbal informed consent was obtained from the patient, patient was informed of potential risk of procedure, including bruising, bleeding, hematoma formation, infection, muscle weakness, muscle pain, numbness, among others.        Sherry Fernandez    Nerve / Sites Muscle Latency Ref. Amplitude Ref. Rel Amp  Segments Distance Velocity Ref. Area    ms ms mV mV %  cm m/s m/s mVms  R Median - APB     Wrist APB 4.5 ?4.4 3.8 ?4.0 100 Wrist - APB 7   11.8     Upper arm APB 8.8  4.9  130 Upper arm - Wrist 24 57 ?49 17.5  L Median - APB     Wrist APB 3.5 ?4.4 8.3 ?4.0 100 Wrist - APB 7   27.0     Upper arm APB 7.8  8.5  102 Upper arm - Wrist 22 52 ?49 27.9  R Ulnar - ADM     Wrist ADM 2.5 ?3.3 10.1 ?6.0 100 Wrist - ADM 7   28.5     B.Elbow ADM 4.7  10.1  99.7 B.Elbow - Wrist 14 62 ?49 26.5     A.Elbow ADM 7.3  9.4  93.3 A.Elbow - B.Elbow 17 65 ?49 27.7           SNC    Nerve / Sites Rec. Site Peak Lat Ref.  Amp Ref. Segments Distance Peak Diff Ref.    ms ms V V  cm ms ms  R Median, Ulnar - Transcarpal comparison     Median Palm Wrist 2.6 ?2.2 54 ?35 Median Palm - Wrist 8       Ulnar Palm Wrist 1.8 ?2.2 34 ?12 Ulnar Palm - Wrist 8          Median Palm - Ulnar Palm  0.8 ?0.4  L Median, Ulnar - Transcarpal comparison     Median Palm Wrist 2.1 ?2.2 53 ?35 Median Palm - Wrist 8       Ulnar Palm Wrist 1.9 ?2.2 11 ?  12 Ulnar Palm - Wrist 8          Median Palm - Ulnar Palm  0.2 ?0.4  R Median - Orthodromic (Dig II, Mid palm)     Dig II Wrist 3.5 ?3.4 9 ?10 Dig II - Wrist 13    L Median - Orthodromic (Dig II, Mid palm)     Dig II Wrist 3.2 ?3.4 12 ?10 Dig II - Wrist 13    R Ulnar - Orthodromic, (Dig V, Mid palm)     Dig V Wrist 2.5 ?3.1 14 ?5 Dig V - Wrist 78                 F  Wave    Nerve F Lat Ref.   ms ms  R Ulnar - ADM 26.3 ?32.0       EMG Summary Table    Spontaneous MUAP Recruitment  Muscle IA Fib PSW Fasc Other Amp Dur. Poly Pattern  R. Deltoid Normal None None None _______ Normal Normal Normal Normal  R. Triceps brachii Normal None None None _______ Normal Normal Normal Normal  R. Pronator teres Normal None None None _______ Normal Normal Normal Normal  R. First dorsal interosseous Normal None None None _______ Normal Normal Normal Normal  R. Cervical paraspinals (low) Normal None  None None _______ Normal Normal Normal Normal  R. Opponens pollicis Normal None None None _______ Normal Increased 3+ Reduced

## 2022-10-26 NOTE — Procedures (Signed)
Full Name: Sherry Fernandez Gender: Female MRN #: [id number] Date of Birth: [birth year]    Visit Date: 10/22/2022 09:27 Age: 62 Years Examining Physician: Dr. Sarina Ill Referring Physician: Dr. Sarina Ill Height: 5 feet 4 inch  History: Right arm hurts, neck pain and radiation. Right hand falls asleep at night, wrist braces help on the right hand. Left arm largely unaffected. [name]  Summary:   Nerve Conduction Studies were performed on the bilateral upper extremities.  The right median APB motor nerve showed prolonged distal onset latency (4.5 ms, N<4.4) and reduced amplitude(3.8 mV, N>4) The right Median 2nd Digit orthodromic sensory nerve showed prolonged distal peak latency (3.5 ms, N<3.4) and reduced amplitude(9 uV, N>10). The right median/ulnar (palm) comparison nerve showed prolonged distal peak latency (Median Palm, 2.6 ms, N<2.2) and abnormal peak latency difference (Median Palm-Ulnar Palm, 0.8 ms, N<0.4) with a relative median delay.  All remaining nerves (as indicated in the following tables) were within normal limits.  [name] showed polyphasic motor units and diminished motor unit recruitment. All remaining muscles (as indicated in the following tables) were within normal limits.     Conclusion:  There is electrophysiologic evidence of moderately-severe right carpal Tunnel Syndrome.  No suggestion of polyneuropathy or cervical radiculopathy.   ------------------------------- Sarina Ill M.D.  [facility] [address] [place], [place] [address] Tel: [phone] Fax: [phone]  Verbal informed consent was obtained from the patient, patient was informed of potential risk of procedure, including bruising, bleeding, hematoma formation, infection, muscle weakness, muscle pain, numbness, among others.        [place]    Nerve / Sites Muscle Latency Ref. Amplitude Ref. Rel Amp  Segments Distance Velocity Ref. Area    ms ms mV mV %  cm m/s m/s mVms  R Median - APB     Wrist APB 4.5 ?4.4 3.8 ?4.0 100 Wrist - APB 7   11.8     Upper arm APB 8.8  4.9  130 Upper arm - Wrist 24 57 ?49 17.5  L Median - APB     Wrist APB 3.5 ?4.4 8.3 ?4.0 100 Wrist - APB 7   27.0     Upper arm APB 7.8  8.5  102 Upper arm - Wrist 22 52 ?49 27.9  R Ulnar - ADM     Wrist ADM 2.5 ?3.3 10.1 ?6.0 100 Wrist - ADM 7   28.5     B.Elbow ADM 4.7  10.1  99.7 B.Elbow - Wrist 14 62 ?49 26.5     A.Elbow ADM 7.3  9.4  93.3 A.Elbow - B.Elbow 17 65 ?49 27.7           SNC    Nerve / Sites Rec. Site Peak Lat Ref.  Amp Ref. Segments Distance Peak Diff Ref.    ms ms V V  cm ms ms  R Median, Ulnar - Transcarpal comparison     Median Palm Wrist 2.6 ?2.2 54 ?35 Median Palm - Wrist 8       Ulnar Palm Wrist 1.8 ?2.2 34 ?12 Ulnar Palm - Wrist 8          Median Palm - Ulnar Palm  0.8 ?0.4  L Median, Ulnar - Transcarpal comparison     Median Palm Wrist 2.1 ?2.2 53 ?35 Median Palm - Wrist 8       Ulnar Palm Wrist 1.9 ?2.2 11 ?  12 Ulnar Palm - Wrist 8          Median Palm - Ulnar Palm  0.2 ?0.4  R Median - Orthodromic (Dig II, Mid palm)     Dig II Wrist 3.5 ?3.4 9 ?10 Dig II - Wrist 13    L Median - Orthodromic (Dig II, Mid palm)     Dig II Wrist 3.2 ?3.4 12 ?10 Dig II - Wrist 13    R Ulnar - Orthodromic, (Dig V, Mid palm)     Dig V Wrist 2.5 ?3.1 14 ?5 Dig V - Wrist 82                 F  Wave    Nerve F Lat Ref.   ms ms  R Ulnar - ADM 26.3 ?32.0       EMG Summary Table    Spontaneous MUAP Recruitment  Muscle IA Fib PSW Fasc Other Amp Dur. Poly Pattern  R. Deltoid Normal None None None _______ Normal Normal Normal Normal  R. Triceps brachii Normal None None None _______ Normal Normal Normal Normal  R. Pronator teres Normal None None None _______ Normal Normal Normal Normal  R. First dorsal interosseous Normal None None None _______ Normal Normal Normal Normal  R. Cervical paraspinals (low) Normal None  None None _______ Normal Normal Normal Normal  R. Opponens pollicis Normal None None None _______ Normal Increased 3+ Reduced

## 2022-10-27 ENCOUNTER — Telehealth: Payer: Self-pay | Admitting: Neurology

## 2022-10-27 NOTE — Telephone Encounter (Signed)
Referral for orthopedic surgery fax to [facility]. Phone: [phone], Fax: [phone].

## 2022-12-28 ENCOUNTER — Encounter: Payer: Self-pay | Admitting: Neurology

## 2023-02-04 ENCOUNTER — Emergency Department (HOSPITAL_BASED_OUTPATIENT_CLINIC_OR_DEPARTMENT_OTHER)
Admission: EM | Admit: 2023-02-04 | Discharge: 2023-02-04 | Disposition: A | Discharge status: Home / Self Care | Payer: 59 | Attending: Emergency Medicine | Admitting: Emergency Medicine

## 2023-02-04 ENCOUNTER — Emergency Department (HOSPITAL_BASED_OUTPATIENT_CLINIC_OR_DEPARTMENT_OTHER): Payer: 59

## 2023-02-04 ENCOUNTER — Other Ambulatory Visit: Payer: Self-pay

## 2023-02-04 ENCOUNTER — Other Ambulatory Visit (HOSPITAL_BASED_OUTPATIENT_CLINIC_OR_DEPARTMENT_OTHER): Payer: Self-pay

## 2023-02-04 ENCOUNTER — Encounter (HOSPITAL_BASED_OUTPATIENT_CLINIC_OR_DEPARTMENT_OTHER): Payer: Self-pay | Admitting: Radiology

## 2023-02-04 ENCOUNTER — Ambulatory Visit: Payer: Self-pay

## 2023-02-04 DIAGNOSIS — R112 Nausea with vomiting, unspecified: Secondary | ICD-10-CM | POA: Insufficient documentation

## 2023-02-04 DIAGNOSIS — R079 Chest pain, unspecified: Secondary | ICD-10-CM | POA: Insufficient documentation

## 2023-02-04 DIAGNOSIS — R55 Syncope and collapse: Secondary | ICD-10-CM | POA: Diagnosis not present

## 2023-02-04 LAB — COMPREHENSIVE METABOLIC PANEL
ALT: 23 U/L (ref 0–44)
AST: 22 U/L (ref 15–41)
Albumin: 4.5 g/dL (ref 3.5–5.0)
Alkaline Phosphatase: 61 U/L (ref 38–126)
Anion gap: 11 (ref 5–15)
BUN: 26 mg/dL — ABNORMAL HIGH (ref 8–23)
CO2: 24 mmol/L (ref 22–32)
Calcium: 9.1 mg/dL (ref 8.9–10.3)
Chloride: 104 mmol/L (ref 98–111)
Creatinine, Ser: 1.23 mg/dL — ABNORMAL HIGH (ref 0.44–1.00)
GFR, Estimated: 49 mL/min — ABNORMAL LOW (ref >60)
Glucose, Bld: 135 mg/dL — ABNORMAL HIGH (ref 70–99)
Potassium: 3.8 mmol/L (ref 3.5–5.1)
Sodium: 139 mmol/L (ref 135–145)
Total Bilirubin: 0.6 mg/dL (ref 0.3–1.2)
Total Protein: 6.9 g/dL (ref 6.5–8.1)

## 2023-02-04 LAB — CBC
HCT: 46.3 % — ABNORMAL HIGH (ref 36.0–46.0)
Hemoglobin: 15 g/dL (ref 12.0–15.0)
MCH: 28.4 pg (ref 26.0–34.0)
MCHC: 32.4 g/dL (ref 30.0–36.0)
MCV: 87.5 fL (ref 80.0–100.0)
Platelets: 175 10*3/uL (ref 150–400)
RBC: 5.29 MIL/uL — ABNORMAL HIGH (ref 3.87–5.11)
RDW: 13.9 % (ref 11.5–15.5)
WBC: 13.6 10*3/uL — ABNORMAL HIGH (ref 4.0–10.5)
nRBC: 0 % (ref 0.0–0.2)

## 2023-02-04 LAB — TROPONIN I (HIGH SENSITIVITY)
Troponin I (High Sensitivity): 2 ng/L (ref <18)
Troponin I (High Sensitivity): 3 ng/L (ref <18)

## 2023-02-04 LAB — LIPASE, BLOOD: Lipase: 54 U/L — ABNORMAL HIGH (ref 11–51)

## 2023-02-04 LAB — D-DIMER, QUANTITATIVE: D-Dimer, Quant: 1.1 ug/mL-FEU — ABNORMAL HIGH (ref 0.00–0.50)

## 2023-02-04 MED ORDER — ONDANSETRON HCL 8 MG PO TABS: 8.0000 mg | ORAL_TABLET | Freq: Three times a day (TID) | ORAL | 0 refills | Status: DC | PRN

## 2023-02-04 MED ORDER — LACTATED RINGERS IV BOLUS
500.0000 mL | Freq: Once | INTRAVENOUS | Status: AC
  Administered 2023-02-04: 500 mL via INTRAVENOUS

## 2023-02-04 MED ORDER — ONDANSETRON HCL 4 MG/2ML IJ SOLN
4.0000 mg | Freq: Once | INTRAMUSCULAR | Status: AC
  Administered 2023-02-04: 4 mg via INTRAVENOUS
  Filled 2023-02-04: qty 2

## 2023-02-04 MED ORDER — IOHEXOL 350 MG/ML SOLN
100.0000 mL | Freq: Once | INTRAVENOUS | Status: AC | PRN
  Administered 2023-02-04: 60 mL via INTRAVENOUS

## 2023-02-04 NOTE — ED Triage Notes (Signed)
Pt arrives pov, steady gait with c/o central CP x 1 week, endorses n/v Tuesday that returned this am. Endorses infusion of "viepty" for migraines. Pt also reports shob. Pt endorses syncope x 2 today

## 2023-02-04 NOTE — ED Notes (Signed)
Pt discharged to home using teachback Method. Discharge instructions have been discussed with patient and/or family members. Pt verbally acknowledges understanding d/c instructions, has been given opportunity for questions to be answered, and endorses comprehension to checkout at registration before leaving.  

## 2023-02-04 NOTE — Discharge Instructions (Addendum)
Please follow-up with your doctor Continue Gatorade mixed with water frequently so that you are urinating at least every 3 hours Return to the emergency department if you are having any worsening symptoms or symptoms do not resolve in 24 hours.

## 2023-02-04 NOTE — ED Notes (Signed)
Patient transported to CT 

## 2023-02-04 NOTE — ED Provider Notes (Signed)
[facility] AT [facility] Provider Note   CSN: [id number] Arrival date & time: 02/04/23  0750     History  Chief Complaint  Patient presents with   Chest Pain    Sherry Fernandez is a 62 y.o. female.  HPI 62 year old female history of migraine headaches, renal cell carcinoma, status post right rotator cuff surgery with regional pain syndrome presents today complaining of nausea and vomiting.  She states she had a my appetite infusion 2 days ago and vomited 1 time after that.  She has been well as far as nausea the since that time.  This morning she woke up around 5 AM and has had multiple episodes of vomiting.  These are nonbilious and nonbloody.  She reports that she had 2 syncopal episodes while going to the commode the nausea.  She is not injured with these.  She has had chest pain for the past week that she describes as like indigestion and has been basically constant although she still has pain once or twice that has been relieved by her about an hour.  She describes nothing making this worse or better.  At the same time, she has had some dyspnea.  She has no prior history of lung problems or breathing problems.  She is not a smoker.  She denies any history of DVT or PE.  She has not noted any swelling in her arms or legs.     Home Medications Prior to Admission medications   Medication Sig Start Date End Date Taking? Authorizing Provider  ondansetron (ZOFRAN) 8 MG tablet Take 1 tablet (8 mg total) by mouth every 8 (eight) hours as needed for nausea or vomiting. 02/04/23  Yes Margarita Grizzle, MD  pregabalin (LYRICA) 75 MG capsule Take 75 mg by mouth 2 (two) times daily. 01/12/23  Yes [provider]  temazepam (RESTORIL) 30 MG capsule Take 30 mg by mouth daily. 12/29/22  Yes [provider]  DEXILANT 60 MG capsule Take 60 mg by mouth daily before breakfast. 08/15/19   [provider]  Eptinezumab-jjmr (VYEPTI IV) Inject 300 mg into the  vein every 3 (three) months.    Anson Fret, MD  famotidine (PEPCID) 40 MG tablet Take 40 mg by mouth as needed for heartburn or indigestion.    [provider]      Allergies    Sulfamethoxazole    Review of Systems   Review of Systems  Physical Exam Updated Vital Signs BP 117/65   Pulse (!) 102   Temp 98.3 F (36.8 C) (Oral)   Resp 15   Wt 77.3 kg   SpO2 96%   BMI 29.25 kg/m  Physical Exam Vitals and nursing note reviewed.  Constitutional:      Appearance: She is well-developed.  HENT:     Head: Normocephalic and atraumatic.     Right Ear: External ear normal.     Left Ear: External ear normal.     Nose: Nose normal.  Eyes:     Conjunctiva/sclera: Conjunctivae normal.     Pupils: Pupils are equal, round, and reactive to light.  Neck:     Thyroid: No thyromegaly.     Vascular: No JVD.     Trachea: No tracheal deviation.  Cardiovascular:     Rate and Rhythm: Normal rate and regular rhythm.     Heart sounds: Normal heart sounds.  Pulmonary:     Effort: Pulmonary effort is normal.     Breath  sounds: Normal breath sounds. No wheezing.  Abdominal:     General: Bowel sounds are normal.     Palpations: Abdomen is soft. There is no mass.     Tenderness: There is no abdominal tenderness. There is no guarding.  Musculoskeletal:        General: Normal range of motion.     Cervical back: Normal range of motion and neck supple.  Lymphadenopathy:     Cervical: No cervical adenopathy.  Skin:    General: Skin is warm and dry.  Neurological:     General: No focal deficit present.     Mental Status: She is alert and oriented to person, place, and time.     GCS: GCS eye subscore is 4. GCS verbal subscore is 5. GCS motor subscore is 6.     Cranial Nerves: No cranial nerve deficit.     Sensory: No sensory deficit.     Motor: No weakness.     Coordination: Coordination normal.     Gait: Gait normal.     Deep Tendon Reflexes: Reflexes are normal and symmetric.  Babinski sign absent on the right side. Babinski sign absent on the left side.     Reflex Scores:      Bicep reflexes are 2+ on the right side and 2+ on the left side.      Patellar reflexes are 2+ on the right side and 2+ on the left side.    Comments: Strength is normal and equal throughout. Cranial nerves grossly intact. Patient fluent. No gross ataxia and patient able to ambulate without difficulty.  Psychiatric:        Behavior: Behavior normal.        Thought Content: Thought content normal.        Judgment: Judgment normal.     ED Results / Procedures / Treatments   Labs (all labs ordered are listed, but only abnormal results are displayed) Labs Reviewed  D-DIMER, QUANTITATIVE - Abnormal; Notable for the following components:      Result Value   D-Dimer, Quant 1.10 (*)    All other components within normal limits  CBC - Abnormal; Notable for the following components:   WBC 13.6 (*)    RBC 5.29 (*)    HCT 46.3 (*)    All other components within normal limits  COMPREHENSIVE METABOLIC PANEL - Abnormal; Notable for the following components:   Glucose, Bld 135 (*)    BUN 26 (*)    Creatinine, Ser 1.23 (*)    GFR, Estimated 49 (*)    All other components within normal limits  LIPASE, BLOOD - Abnormal; Notable for the following components:   Lipase 54 (*)    All other components within normal limits  TROPONIN I (HIGH SENSITIVITY)  TROPONIN I (HIGH SENSITIVITY)    EKG None  Radiology CT Angio Chest PE W and/or Wo Contrast  Result Date: 02/04/2023 CLINICAL DATA:  Chest pain. EXAM: CT ANGIOGRAPHY CHEST WITH CONTRAST TECHNIQUE: Multidetector CT imaging of the chest was performed using the standard protocol during bolus administration of intravenous contrast. Multiplanar CT image reconstructions and MIPs were obtained to evaluate the vascular anatomy. RADIATION DOSE REDUCTION: This exam was performed according to the departmental dose-optimization program which includes  automated exposure control, adjustment of the mA and/or kV according to patient size and/or use of iterative reconstruction technique. CONTRAST:  60mL OMNIPAQUE IOHEXOL 350 MG/ML SOLN COMPARISON:  June 04, 2022. FINDINGS: Cardiovascular: Satisfactory opacification of the pulmonary arteries to  the segmental level. No evidence of pulmonary embolism. Normal heart size. No pericardial effusion. Mediastinum/Nodes: No enlarged mediastinal, hilar, or axillary lymph nodes. Thyroid gland, trachea, and esophagus demonstrate no significant findings. Lungs/Pleura: Lungs are clear. No pleural effusion or pneumothorax. Upper Abdomen: No acute abnormality. Musculoskeletal: No chest wall abnormality. No acute or significant osseous findings. Review of the MIP images confirms the above findings. IMPRESSION: No definite evidence of pulmonary embolus. No acute abnormality seen in the chest. Electronically Signed   By: Lupita Raider M.D.   On: 02/04/2023 10:23   DG Chest Port 1 View  Result Date: 02/04/2023 CLINICAL DATA:  One-week history of chest pain associated with nausea, vomiting, and shortness of breath EXAM: PORTABLE CHEST 1 VIEW COMPARISON:  Chest radiograph dated 06/16/2021 FINDINGS: Normal lung volumes. No focal consolidations. No pleural effusion or pneumothorax. The heart size and mediastinal contours are within normal limits. No acute osseous abnormality. IMPRESSION: No active disease. Electronically Signed   By: Agustin Cree M.D.   On: 02/04/2023 08:41    Procedures Procedures    Medications Ordered in ED Medications  ondansetron (ZOFRAN) injection 4 mg (4 mg Intravenous Given 02/04/23 0810)  lactated ringers bolus 500 mL (0 mLs Intravenous Stopped 02/04/23 1034)  iohexol (OMNIPAQUE) 350 MG/ML injection 100 mL (60 mLs Intravenous Contrast Given 02/04/23 0953)    ED Course/ Medical Decision Making/ A&P Clinical Course as of 02/04/23 1107  Thu Feb 04, 2023  0915 CBC is reviewed interpreted significant for  mild leukocytosis with white blood cell count 13,600 otherwise within normal limits Complete metabolic panel is reviewed and interpreted significant for mild hyperglycemia at 135 and creatinine 1.23 which is stable from first prior Lipase is slightly elevated at 54 [DR]  0916 Chest x-Ronda Rajkumar is reviewed and interpreted and shows no evidence of acute abnormality on my interpretation and radiologist interpretation concurs [DR]  0922 D-dimer is slightly elevated at 1.10 Given symptoms will obtain CTA [DR]  1052 CT angio chest with no definite acute pulmonary embolism and no evidence of acute pulmonary abnormalities on my interpretation radiologist interpretation concurs [DR]    Clinical Course User Index [DR] Margarita Grizzle, MD                             Medical Decision Making Amount and/or Complexity of Data Reviewed Labs: ordered. Radiology: ordered.  Risk Prescription drug management.   Differential diagnosis includes but is not limited to acute coronary syndrome, acute lung infection, PE, gastroenteritis 1- nausea and vomiting-patient with episodes of nausea and vomiting today.  She is given antiemetics here in the emergency department.  She is given IV fluids.  Electrolytes appear normal.  She is not febrile but has a mild leukocytosis.  Differential diagnosis includes but is not limited to gastroenteritis, upper abdominal etiology such as gastritis, pancreatitis, patient is status postcholecystectomy. Patient was evaluated here with labs and had a mildly elevated lipase of 52.  Her abdomen is soft and nontender.  I do not think that she has any acute intra-abdominal etiology going on.  The upper abdomen was seen on the CTA.  It is noted not appear to be any acute abnormality.  She is tolerating liquids here.  She will have Zofran sent to her pharmacy. 2 chest pain with some dyspnea for the past week.  Patient has normal oxygen saturations.  She did recently have a shoulder surgery.  She was  evaluated here with D-dimer  and then had CTA.  There is no evidence of blood clot, infiltrate, or acute pulmonary abnormalities to account for either her chest pain or her dyspnea.  Her EKG is normal and her troponins are normal x 2 making ACS very unlikely etiology 3 syncope patient had syncopal episode today when she became nauseated and was trying to get to the bathroom.  I suspect this was a vagal episode.  She was evaluated here with EKG, cardiac enzymes, electrolytes that all appear noncontributory.  Patient also had some mild volume depletion with the 2 episodes of vomiting which may have contributed to this. Patient peers stable for discharge.  I discussed return precautions and need for follow-up and she voices understanding.        Final Clinical Impression(s) / ED Diagnoses Final diagnoses:  Nausea and vomiting, unspecified vomiting type  Vasovagal syncope  Chest pain, unspecified type    Rx / DC Orders ED Discharge Orders          Ordered    ondansetron (ZOFRAN) 8 MG tablet  Every 8 hours PRN        02/04/23 1106              Margarita Grizzle, MD 02/04/23 1107

## 2023-02-04 NOTE — ED Notes (Signed)
ED Provider at bedside. 

## 2023-02-04 NOTE — Telephone Encounter (Signed)
  Chief Complaint: Needs to know if pt was tested for food poisoning at [facility] today Symptoms: Vomiting - fainting Frequency: today Pertinent Negatives: Patient denies  Disposition: [] ED /[x] Urgent Care (no appt availability in office) / [] Appointment(In office/virtual)/ []  [facility]/ [] Home Care/ [] Refused Recommended Disposition /[] [facility]/ []  Follow-up with PCP Additional Notes: PT was seen at [facility] today. Pt now realizes that she ate raw cupcake batter and wonders if this might be food poisoning. Spoke with [place] - daughter in law with permission form pt.  They will call [facility] for further information regarding this testing.    Answer Assessment - Initial Assessment Questions 1. REASON FOR CALL or QUESTION: "What is your reason for calling today?" or "How can I best help you?" or "What question do you have that I can help answer?"     Was pt tested for food poisoning at [facility]?  Protocols used: Information Only Call - No Triage-A-AH

## 2023-02-25 ENCOUNTER — Telehealth: Payer: Self-pay | Admitting: Neurology

## 2023-02-25 NOTE — Telephone Encounter (Signed)
Spoke with Dr Lucia Gaskins. Pt will need an appt. ?switch to botox next but need to discuss. Offer Thursday 03/11/23 at 8  AM video visit.

## 2023-02-25 NOTE — Telephone Encounter (Addendum)
Per [facility], patient received Vyepti 100 mg on 10/29/22, 02/02/23, and next one is 04/27/23

## 2023-02-25 NOTE — Telephone Encounter (Signed)
Per Dr Lucia Gaskins, for now we will just have [facility] cancel the appointment for [facility] but not the whole authorization. I let Holly know.

## 2023-02-25 NOTE — Telephone Encounter (Signed)
I spoke with the patient. She is not sure if the Vyepti is the cause but she doesn't want to take any chances. She was on prednisone for 3 weeks and that was her last day when she had the last Vyepti.  She would like to cancel it going forward.  She accepted the appointment for Thursday, July 11 at 8 AM.  I notified [facility] w/ [facility] to cancel Vyepti.

## 2023-02-25 NOTE — Telephone Encounter (Signed)
Pt stated she wants to talk to Dr. Lucia Gaskins about infusion treatment. Stated she got really sick after treatment and ended up in the ER because she couldn't stop throwing-up. Pt also stated he had a headache for a whole week after her treatment. Pt requesting a call back from nurse to discuss.

## 2023-02-25 NOTE — Telephone Encounter (Signed)
I am waiting to hear back from [facility] w/ [facility] as to when patient has had Vyepti infusions.

## 2023-03-02 ENCOUNTER — Telehealth: Payer: Self-pay | Admitting: Neurology

## 2023-03-02 NOTE — Telephone Encounter (Signed)
error 

## 2023-03-11 ENCOUNTER — Telehealth: Payer: 59 | Admitting: Neurology

## 2023-03-18 ENCOUNTER — Encounter: Payer: Self-pay | Admitting: Neurology

## 2023-03-18 ENCOUNTER — Telehealth (INDEPENDENT_AMBULATORY_CARE_PROVIDER_SITE_OTHER): Payer: 59 | Admitting: Neurology

## 2023-03-18 ENCOUNTER — Telehealth: Payer: 59 | Admitting: Neurology

## 2023-03-18 ENCOUNTER — Telehealth: Payer: Self-pay | Admitting: Neurology

## 2023-03-18 DIAGNOSIS — G43709 Chronic migraine without aura, not intractable, without status migrainosus: Secondary | ICD-10-CM

## 2023-03-18 MED ORDER — AJOVY 225 MG/1.5ML ~~LOC~~ SOAJ
225.0000 mg | SUBCUTANEOUS | 11 refills | Status: AC
Start: 2023-03-18 — End: ?

## 2023-03-18 NOTE — Telephone Encounter (Signed)
Call patient, video appointment, 1pm aug 26th with dr Lucia Gaskins please and thank you

## 2023-03-18 NOTE — Progress Notes (Addendum)
[facility]    Provider:  Dr Lucia Gaskins Requesting Provider: Merri Brunette, MD Primary Care Provider:  Merri Brunette, MD  Virtual Visit via Video Note  I connected with [name] on 03/21/23 at  2:00 PM EDT by a video enabled telemedicine application and verified that I am speaking with the correct person using two identifiers.  Location: Patient: home Provider: office   I discussed the limitations of evaluation and management by telemedicine and the availability of in person appointments. The patient expressed understanding and agreed to proceed.  Follow Up Instructions:    I discussed the assessment and treatment plan with the patient. The patient was provided an opportunity to ask questions and all were answered. The patient agreed with the plan and demonstrated an understanding of the instructions.   The patient was advised to call back or seek an in-person evaluation if the symptoms worsen or if the condition fails to improve as anticipated.  I provided over 30 minutes of non-face-to-face time during this encounter.   Anson Fret, MD  Adddendum 05/06/2023: Better on Ajovy now can prescribe nurtec. 6 migraine days a month and < 10 total headache days a month. Prescribe nurtec.    CC:  migraine  March 18, 2023: Botox was denied because it was not a covered benefit under her insurance.  She tried Vyepti 100 mg and had a terrible reaction she was vomiting and in the emergency room.  We ordered an MRI of the cervical spine and sent her to neurosurgery and performed an EMG nerve conduction study which showed carpal tunnel.  We could see if she could try Aimovig or try to get her Ajovy again, we can discuss Nurtec every other day if she qualifies.  But she has tried multiple triptans, and other migraine acute medications and many preventatives. She did well on Vyepti 100 but then had extreme vomiting going to ED with 300. Doesn;t want to try it again.  Having >8  migraines and > 15 headache days a month for last 6 months, rechallenge ajovy.  Tried the [employer]. Next would try qulipta. Discussed MRi cervical spine. She is seeing Orson Slick her CTS. Norris did the hand but now with gramig. Also seeing Norris for shoulder. She saw Wynetta Emery for her neck.    She has tried and failed multiple medications for her migraines > 3 months or reaction.  She is tried a plethora of medications including Emgality Nurtec and Ubrelvy. zavagepant  Current and past medications: ANALGESICS: tylenol ANTI-MIGRAINE:Maxalt, naratriptan, imitrex HEART/BP: propranolol, metoprolol DECONGESTANT/ANTIHISTAMINE: ANTI-NAUSEANT: NSAIDS: Advil (RENAL CELL CA), tylenol MUSCLE RELAXANTS: tizanidine ANTI-CONVULSANTS: Topamax (RENAL CELL CA), gabapentin, Lyrica STEROIDS: (RENAL CELL CA) SLEEPING PILLS/TRANQUILIZERS: ANTI-DEPRESSANTS: amitriptyline, nortriptyline HERBAL: FIBROMYALGIA: HORMONAL: OTHER: Ajovy helped tremendously , Emgality, nurtec, ubrelvy helped, aimovig contraindicated due to constipation, Botox is not covered under her insurance we could not get it approved, Vyepti had a serious reaction PROCEDURES FOR HEADACHES:   MRI cervical spine: personally reviewed images and agree with the following IMPRESSION:    MRI cervical spine (without) demonstrating: - At C3-4 disc bulging and uncovertebral joint hypertrophy with severe biforaminal stenosis. - At C4-5 disc bulging and uncovertebral joint hypertrophy with moderate biforaminal stenosis. - At C5-6 disc bulging with severe left foraminal stenosis. - At C6-7 disc bulging with moderate left foraminal stenosis.  Patient complains of symptoms per HPI as well as the following symptoms: neck and should pain . Pertinent negatives and positives per HPI. All others negative  HPI:  Sherry Fernandez is a 62 y.o. female here as requested by Merri Brunette, MD for migraines. PMHx migraines. She has terrible neck pain, been to  PT for months, failed conservative therapy. She has neck pain. She has pain radiating down the right arm. Migraines started in her 2nd pregnancy in 1993. Daughter has migraines. No aura. Since then she gets violently sick, nausea, vomiting, pulsating/pounding/throbbing, photo/phonophobia, hurts to move, a dark room helps, quiet room, sleep helps, associated severe neck tightness and pain, usually starts on the right behind the eye and radiates to the whole head and neck, she has had one migraine last 21 days 16 years ago, now last 24-72 hours, ubrelvy used to help but not doing much anymore, nurtec has helped. She is having 15 migraine days a month, no other headaches. Moderate to severe. Alcohol is a huge trigger. No medication overuse. She has weakness in her right arm and also she is imbalanced. Ongoing migraines at this severity and frequ for years. She ha been to neurology. Imbalance, falls, ataxia, dragging right foot.   Reviewed notes, labs and imaging from outside physicians, which showed;  MRI brain 2011: Clinical Data: Thunder clap headaches.  Vertigo, diplopia. History  of migraine headaches.    MRI HEAD WITHOUT AND WITH CONTRAST    Technique:  Multiplanar, multiecho pulse sequences of the brain and  surrounding structures were obtained according to standard protocol  without and with intravenous contrast    Contrast: Multihance of 20 ml.    Comparison: None. CT scan of the brain of 05/25/2006.    Findings:    Wallace Cullens white matter differentiation is normal.  The sella is  appropriate in signal and morphology for patient's age.    Clival signal is normal.  Cerebellar tonsils are at the level of  the foramen magnum.    The odontoid process, the predental space and the pre vertebral  soft tissues are within normal limits.    Scattered subcentimeter lymph nodes are seen in the posterior  triangle regions and the superficial cervical chain regions  bilaterally.    No diffusion  weighted abnormalities seen.    Axial FLAIR and T2-weighted images demonstrate the  brain signal to  be normal .  Ventricles are normal.  Mastoids are clear.  The  internal auditory canals are symmetrical in signal and morphology.    Flow voids are maintained in the major vessels at the cranial skull  base.    Mild inflammatory thickening  of  the mucosa in  the ethmoid air  cells and the maxillary sinuses  is seen.    Visualized orbital contents are  grossly normal.    No abnormal blood breakdown products seen on SPGR sequences.   No pathological intracranial enhancement noted.  Dural venous  sinuses are widely patent.     IMPRESSION:  1.  No evidence of acute ischemia.  2.  Brain signal normal.  3.  Mild inflammatory mucosal thickening of the paranasal sinuses.   Reviewed prior notes from ladonna cooke  Review of Systems: Patient complains of symptoms per HPI as well as the following symptoms right arm weakness. Pertinent negatives and positives per HPI. All others negative.   Social History   Socioeconomic History   Marital status: Married    Spouse name: Not on file   Number of children: Not on file   Years of education: Not on file   Highest education level: Not on file  Occupational History  Not on file  Tobacco Use   Smoking status: Never   Smokeless tobacco: Never  Vaping Use   Vaping status: Never Used  Substance and Sexual Activity   Alcohol use: Yes    Comment: socially   Drug use: No   Sexual activity: Not on file  Other Topics Concern   Not on file  Social History Narrative   Not on file   Social Determinants of Health   Financial Resource Strain: Not on file  Food Insecurity: Not on file  Transportation Needs: Not on file  Physical Activity: Not on file  Stress: Not on file  Social Connections: Unknown (01/12/2022)   Received from [facility], [facility]   Social Network    Social Network: Not on file  Intimate Partner Violence:  Unknown (12/04/2021)   Received from [facility], [facility]   HITS    Physically Hurt: Not on file    Insult or Talk Down To: Not on file    Threaten Physical Harm: Not on file    Scream or Curse: Not on file    Family History  Problem Relation Age of Onset   Heart failure Father 82   CAD Sister    Sudden death Paternal Grandmother    CAD Daughter        Pt reports that she got 8 stents   Cardiomyopathy Daughter        got an ICD   Migraines Daughter     Past Medical History:  Diagnosis Date   Arthritis    Cancer (HCC)    lt. 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   Abnormal ultrasound of gallbladder 11/07/2019   Renal cell carcinoma (HCC) 10/17/2019   Renal mass 10/04/2019   Other spondylosis with radiculopathy, cervical region 05/03/2017   Hypertension 09/25/2016   Family history of cardiomyopathy 09/25/2016   SINUSITIS- ACUTE-NOS 10/28/2007   JOINT EFFUSION, LEFT KNEE 07/05/2007   Chronic migraine without aura without status migrainosus, not intractable 06/30/2007   Hypercholesterolemia 02/25/2007    Past Surgical History:  Procedure Laterality Date   bladder  N/A    CHOLECYSTECTOMY N/A 11/20/2019   Procedure: LAPAROSCOPIC CHOLECYSTECTOMY WITH INTRAOPERATIVE CHOLANGIOGRAM;  Surgeon: Darnell Level, MD;  Location: [facility];  Service: General;  Laterality: N/A;   HERNIA REPAIR     REFRACTIVE SURGERY Bilateral    right knee arthrocentesis     ROBOT ASSISTED LAPAROSCOPIC NEPHRECTOMY Left 10/04/2019   Procedure: XI ROBOTIC ASSISTED LAPAROSCOPIC NEPHRECTOMY;  Surgeon: Sebastian Ache, MD;  Location: [facility];  Service: Urology;  Laterality: Left;  3 HRS   thyroid nodule biopsy      Current Outpatient Medications  Medication Sig Dispense Refill   Fremanezumab-vfrm (AJOVY) 225  MG/1.5ML SOAJ Inject 225 mg into the skin every 30 (thirty) days. Please run copay card. Bin [id number] PCN [id number] GRP [id number] ID [id number] Exp 12/32/2024 1.5 mL 11   DEXILANT 60 MG capsule Take 60 mg by mouth daily before breakfast.     No current facility-administered medications for this visit.    Allergies as of 03/18/2023 - Review Complete 02/04/2023  Allergen Reaction Noted   Sulfamethoxazole Swelling 02/23/2007    Vitals: There were no vitals taken for this visit. Last Weight:  Wt Readings  from Last 1 Encounters:  02/04/23 170 lb 6.4 oz (77.3 kg)   Last Height:   Ht Readings from Last 1 Encounters:  10/22/22 5\' 4"  (1.626 m)    Physical exam: Exam: Gen: NAD, conversant      CV: Denies palpitations or chest pain or SOB. VS: Breathing at a normal rate.. Not febrile. Eyes: Conjunctivae clear without exudates or hemorrhage  Neuro: Detailed Neurologic Exam  Speech:    Speech is normal; fluent and spontaneous with normal comprehension.  Cognition:    The patient is oriented to person, place, and time;     recent and remote memory intact;     language fluent;     normal attention, concentration,     fund of knowledge Cranial Nerves:    The pupils are equal, round, and reactive to light.  Extraocular movements are intact.  The face is symmetric with normal sensation. The palate elevates in the midline. Hearing intact. Voice is normal. Shoulder shrug is normal. The tongue has normal motion without fasciculations.   Coordination:    Normal finger to nose  Gait:    Normal native gait  Motor Observation:   no involuntary movements noted. Tone:    Appears normal  Posture:    Posture is normal. normal erect    Strength:    Strength is anti-gravity and symmetric in the upper and lower limbs.      Sensation: intact to LT       Assessment/Plan:  Patient with chronic migraines. Also cervical pathology and concomitant CTS  Having >8 migraines and > 15 headache days a  month for last 6 months, rechallenge ajovy.  Tried the [employer].band vyepti Next would try qulipta. Discussed MRi cervical spine. She is seeing Orson Slick her CTS. Norris did the hand but now with gramig. Also seeing Norris for shoulder. She saw Wynetta Emery for her neck. Botox not a covered option.  We discussed CTS and her cervical radiculopathy as well.   She has tried and failed multiple medications for her migraines > 3 months or reaction.  She is tried a plethora of medications including Emgality Nurtec and Ubrelvy.If she improves, ubrelvy acutely helped in the past would prescribe. Next may try qulipta.  Adddendum 05/06/2023: Better on Ajovy now can prescribe nurtec. 6 migraine days a month and < 10 total headache days a month. Prescribe nurtec.   Current and past medications: ANALGESICS: tylenol ANTI-MIGRAINE:Maxalt, naratriptan, imitrex HEART/BP: propranolol, metoprolol DECONGESTANT/ANTIHISTAMINE: ANTI-NAUSEANT: NSAIDS: Advil (RENAL CELL CA), tylenol MUSCLE RELAXANTS: tizanidine ANTI-CONVULSANTS: Topamax (RENAL CELL CA), gabapentin, Lyrica STEROIDS: (RENAL CELL CA) SLEEPING PILLS/TRANQUILIZERS: ANTI-DEPRESSANTS: amitriptyline, nortriptyline HERBAL: FIBROMYALGIA: HORMONAL: OTHER: Ajovy helped tremendously , Emgality, nurtec, ubrelvy helped, aimovig contraindicated due to constipation, Botox is not covered under her insurance we could not get it approved, Vyepti had a serious reaction PROCEDURES FOR HEADACHES:   MRI cervical spine: personally reviewed images and agree with the following IMPRESSION:    MRI cervical spine (without) demonstrating: - At C3-4 disc bulging and uncovertebral joint hypertrophy with severe biforaminal stenosis. - At C4-5 disc bulging and uncovertebral joint hypertrophy with moderate biforaminal stenosis. - At C5-6 disc bulging with severe left foraminal stenosis. - At C6-7 disc bulging with moderate left foraminal stenosis.  No orders of the  defined types were placed in this encounter.  Meds ordered this encounter  Medications   Fremanezumab-vfrm (AJOVY) 225 MG/1.5ML SOAJ    Sig: Inject 225 mg into the skin every 30 (thirty) days. Please run copay  card. Johnney Killian [phone] PCN PDMI GRP [id number] ID [id number] Exp 12/32/2024    Dispense:  1.5 mL    Refill:  11    Please run copay card. Bin [id number] PCN PDMI GRP [id number] ID [id number] Exp 12/32/2024    Cc: Merri Brunette, MD,  Merri Brunette, MD  Naomie Dean, MD  [facility] [address] [place], [place] [address]  Phone [phone] Fax [phone]

## 2023-03-18 NOTE — Telephone Encounter (Signed)
Pt scheduled for vv with Dr. Lucia Gaskins for 04/26/23 at 1pm

## 2023-03-18 NOTE — Telephone Encounter (Signed)
LVM and sent mychart msg informing pt of need to reschedule vv scheduled for 03/18/23 - MD Out

## 2023-03-21 ENCOUNTER — Encounter: Payer: Self-pay | Admitting: Neurology

## 2023-04-26 ENCOUNTER — Telehealth (INDEPENDENT_AMBULATORY_CARE_PROVIDER_SITE_OTHER): Payer: Self-pay | Admitting: Neurology

## 2023-04-26 DIAGNOSIS — G43709 Chronic migraine without aura, not intractable, without status migrainosus: Secondary | ICD-10-CM

## 2023-04-27 NOTE — Progress Notes (Signed)
Patient left, systems were down, rescheduled. Couldn;t get into EPIC

## 2023-05-06 ENCOUNTER — Other Ambulatory Visit (HOSPITAL_COMMUNITY): Payer: Self-pay

## 2023-05-06 ENCOUNTER — Other Ambulatory Visit: Payer: Self-pay | Admitting: Neurology

## 2023-05-06 ENCOUNTER — Telehealth: Payer: Self-pay

## 2023-05-06 DIAGNOSIS — G43009 Migraine without aura, not intractable, without status migrainosus: Secondary | ICD-10-CM

## 2023-05-06 MED ORDER — NURTEC 75 MG PO TBDP: 75.0000 mg | ORAL_TABLET | Freq: Every day | ORAL | 11 refills | Status: AC | PRN

## 2023-05-06 NOTE — Telephone Encounter (Signed)
*  [facility]  Prior Authorization form/request asks a question that requires your assistance. Please see the question below and advise accordingly.    Is patient going to continue on therapy with Ajovy? Chart notes state that this was not helping.     Key: [id number]

## 2023-05-13 ENCOUNTER — Telehealth: Payer: Self-pay | Admitting: *Deleted

## 2023-05-13 NOTE — Telephone Encounter (Signed)
Pt is unable to pick up her Nurtec even with savings card. Please do PA asap. Thank you!

## 2023-05-14 ENCOUNTER — Other Ambulatory Visit (HOSPITAL_COMMUNITY): Payer: Self-pay

## 2023-05-14 ENCOUNTER — Telehealth: Payer: Self-pay

## 2023-05-14 NOTE — Telephone Encounter (Signed)
Pharmacy Patient Advocate Encounter   Received notification from Physician's Office that prior authorization for Nurtec 75MG  dispersible tablets is required/requested.   Insurance verification completed.   The patient is insured through [facility] .   Per test claim: PA required; PA submitted to [facility] via CoverMyMeds Key/confirmation #/EOC [id number] Status is pending

## 2023-05-18 NOTE — Telephone Encounter (Signed)
PA request has been Submitted. New Encounter created for follow up. For additional info see Pharmacy Prior Auth telephone encounter from 05/14/2023.

## 2023-05-21 ENCOUNTER — Other Ambulatory Visit (HOSPITAL_COMMUNITY): Payer: Self-pay

## 2023-05-21 NOTE — Telephone Encounter (Signed)
Pharmacy Patient Advocate Encounter  Received notification from [facility] that Prior Authorization for Nurtec 75MG  dispersible tablets has been APPROVED from 05/14/2023 to 05/13/2024   PA #/Case ID/Reference #: PA Case ID #: [id number]  Insurance approved for only 8 tablets per month.

## 2024-04-17 ENCOUNTER — Other Ambulatory Visit: Payer: Self-pay | Admitting: Neurology

## 2024-04-17 DIAGNOSIS — G43709 Chronic migraine without aura, not intractable, without status migrainosus: Secondary | ICD-10-CM

## 2024-04-18 ENCOUNTER — Telehealth: Admitting: Adult Health

## 2024-04-18 ENCOUNTER — Telehealth: Payer: Self-pay | Admitting: *Deleted

## 2024-04-18 DIAGNOSIS — G43009 Migraine without aura, not intractable, without status migrainosus: Secondary | ICD-10-CM | POA: Diagnosis not present

## 2024-04-18 DIAGNOSIS — G43709 Chronic migraine without aura, not intractable, without status migrainosus: Secondary | ICD-10-CM

## 2024-04-18 MED ORDER — AJOVY 225 MG/1.5ML ~~LOC~~ SOAJ: 225.0000 mg | SUBCUTANEOUS | Status: AC

## 2024-04-18 NOTE — Addendum Note (Signed)
 Addended by: SHONA SAVANT A on: 04/18/2024 03:57 PM   Modules accepted: Orders

## 2024-04-18 NOTE — Telephone Encounter (Signed)
 Per Megan,NP ok for patient to have Ajovy  sample Placed Ajovy  in refrigerator . Pt to pick up this week

## 2024-04-18 NOTE — Progress Notes (Signed)
 PATIENT: Sherry Fernandez DOB: May 11, 1961  REASON FOR VISIT: follow up HISTORY FROM: patient  Virtual Visit via Video Note  I connected with Sherry Fernandez on 04/18/24 at  3:00 PM EDT by a video enabled telemedicine application located remotely at [facility] and verified that I am speaking with the correct person using two identifiers who was located at their own home in [place]   I discussed the limitations of evaluation and management by telemedicine and the availability of in person appointments. The patient expressed understanding and agreed to proceed.   PATIENT: Sherry Fernandez DOB: 1961-06-10  REASON FOR VISIT: follow up HISTORY FROM: patient  HISTORY OF PRESENT ILLNESS: Today 04/18/24  Sherry Fernandez is a 63 y.o. female with a history of Migraine headaches. Returns today for follow-up.  She reports her headache Has responded the best to Ajovy .  She essentially does not have any migraines as long as she is not taking Ajovy .  She is having a hard time getting her insurance to cover it.  Will be switching to Medicare next month.  She states Nurtec is really not that helpful.  She returns today for an evaluation.  HISTORY March 18, 2023: Botox was denied because it was not a covered benefit under her insurance.  She tried Vyepti 100 mg and had a terrible reaction she was vomiting and in the emergency room.  We ordered an MRI of the cervical spine and sent her to neurosurgery and performed an EMG nerve conduction study which showed carpal tunnel.  We could see if she could try Aimovig or try to get her Ajovy  again, we can discuss Nurtec every other day if she qualifies.  But she has tried multiple triptans, and other migraine acute medications and many preventatives. She did well on Vyepti 100 but then had extreme vomiting going to ED with 300. Doesn;t want to try it again.  Having >8 migraines and > 15 headache days a month for last 6 months, rechallenge ajovy .  Tried the  aimovig and emgality. Next would try qulipta. Discussed MRi cervical spine. She is seeing Camella grout her CTS. Norris did the hand but now with gramig. Also seeing Norris for shoulder. She saw Onetha for her neck.      She has tried and failed multiple medications for her migraines > 3 months or reaction.  She is tried a plethora of medications including Emgality Nurtec and Ubrelvy. zavagepant   Current and past medications: ANALGESICS: tylenol  ANTI-MIGRAINE:Maxalt, naratriptan, imitrex  HEART/BP: propranolol, metoprolol  DECONGESTANT/ANTIHISTAMINE: ANTI-NAUSEANT: NSAIDS: Advil  (RENAL CELL CA), tylenol  MUSCLE RELAXANTS: tizanidine  ANTI-CONVULSANTS: Topamax (RENAL CELL CA), gabapentin , Lyrica STEROIDS: (RENAL CELL CA) SLEEPING PILLS/TRANQUILIZERS: ANTI-DEPRESSANTS: amitriptyline, nortriptyline HERBAL: FIBROMYALGIA: HORMONAL: OTHER: Ajovy  helped tremendously , Emgality, nurtec, ubrelvy helped, aimovig contraindicated due to constipation, Botox is not covered under her insurance we could not get it approved, Vyepti had a serious reaction  REVIEW OF SYSTEMS: Out of a complete 14 system review of symptoms, the patient complains only of the following symptoms, and all other reviewed systems are negative.  ALLERGIES: Allergies  Allergen Reactions   Sulfamethoxazole Swelling    HOME MEDICATIONS: Outpatient Medications Prior to Visit  Medication Sig Dispense Refill   DEXILANT 60 MG capsule Take 60 mg by mouth daily before breakfast.     Fremanezumab -vfrm (AJOVY ) 225 MG/1.5ML SOAJ Inject 225 mg into the skin every 30 (thirty) days. Please run copay card. Bin [id number] PCN [id number] GRP [id number] ID [id number] Exp 12/32/2024 1.5  mL 11   Rimegepant Sulfate (NURTEC) 75 MG TBDP Take 1 tablet (75 mg total) by mouth daily as needed. For migraines. Take as close to onset of migraine as possible. One daily maximum. Please use copay card: BIN [id number] PCN CN GRP [id number] ID [id number] 16 tablet 11   No  facility-administered medications prior to visit.    PAST MEDICAL HISTORY: Past Medical History:  Diagnosis Date   Arthritis    Cancer (HCC)    lt. kidney   Diverticulitis    FH: CAD (coronary artery disease)    HTN (hypertension)    Hypercholesterolemia    Migraine    frequent    PAST SURGICAL HISTORY: Past Surgical History:  Procedure Laterality Date   bladder  N/A    CHOLECYSTECTOMY N/A 11/20/2019   Procedure: LAPAROSCOPIC CHOLECYSTECTOMY WITH INTRAOPERATIVE CHOLANGIOGRAM;  Surgeon: Eletha Boas, MD;  Location: [facility];  Service: General;  Laterality: N/A;   HERNIA REPAIR     REFRACTIVE SURGERY Bilateral    right knee arthrocentesis     ROBOT ASSISTED LAPAROSCOPIC NEPHRECTOMY Left 10/04/2019   Procedure: XI ROBOTIC ASSISTED LAPAROSCOPIC NEPHRECTOMY;  Surgeon: Alvaro Hummer, MD;  Location: [facility];  Service: Urology;  Laterality: Left;  3 HRS   thyroid  nodule biopsy      FAMILY HISTORY: Family History  Problem Relation Age of Onset   Heart failure Father 92   CAD Sister    Sudden death Paternal Grandmother    CAD Daughter        Pt reports that she got 8 stents   Cardiomyopathy Daughter        got an ICD   Migraines Daughter     SOCIAL HISTORY: Social History   Socioeconomic History   Marital status: Married    Spouse name: Not on file   Number of children: Not on file   Years of education: Not on file   Highest education level: Not on file  Occupational History   Not on file  Tobacco Use   Smoking status: Never   Smokeless tobacco: Never  Vaping Use   Vaping status: Never Used  Substance and Sexual Activity   Alcohol use: Yes    Comment: socially   Drug use: No   Sexual activity: Not on file  Other Topics Concern   Not on file  Social History Narrative   Not on file   Social Drivers of Health   Financial Resource Strain: Not on file  Food Insecurity: Not on file  Transportation Needs: Not on file  Physical Activity: Not on file  Stress: Not  on file  Social Connections: Unknown (01/12/2022)   [facility]   Social Network    Social Network: Not on file  Intimate Partner Violence: Unknown (12/04/2021)   Received from [facility]   HITS    Physically Hurt: Not on file    Insult or Talk Down To: Not on file    Threaten Physical Harm: Not on file    Scream or Curse: Not on file      PHYSICAL EXAM Generalized: Well developed, in no acute distress   Neurological examination  Mentation: Alert oriented to time, place, history taking. Follows all commands speech and language fluent Cranial nerve II-XII: Facial symmetry noted.   DIAGNOSTIC DATA (LABS, IMAGING, TESTING) - I reviewed patient records, labs, notes, testing and imaging myself where available.  Lab Results  Component Value Date   WBC 13.6 (H) 02/04/2023   HGB 15.0  02/04/2023   HCT 46.3 (H) 02/04/2023   MCV 87.5 02/04/2023   PLT 175 02/04/2023      Component Value Date/Time   NA 139 02/04/2023 0806   NA 143 09/06/2019 1032   K 3.8 02/04/2023 0806   CL 104 02/04/2023 0806   CO2 24 02/04/2023 0806   GLUCOSE 135 (H) 02/04/2023 0806   BUN 26 (H) 02/04/2023 0806   BUN 13 09/06/2019 1032   CREATININE 1.23 (H) 02/04/2023 0806   CREATININE 0.78 09/25/2016 1219   CALCIUM 9.1 02/04/2023 0806   PROT 6.9 02/04/2023 0806   ALBUMIN 4.5 02/04/2023 0806   AST 22 02/04/2023 0806   ALT 23 02/04/2023 0806   ALKPHOS 61 02/04/2023 0806   BILITOT 0.6 02/04/2023 0806   GFRNONAA 49 (L) 02/04/2023 0806   GFRAA 59 (L) 11/13/2019 0857   Lab Results  Component Value Date   CHOL 222 (HH) 06/23/2007   HDL 29.1 (L) 06/23/2007   LDLDIRECT 180.5 06/23/2007   TRIG 119 06/23/2007   CHOLHDL 7.6 CALC 06/23/2007   Lab Results  Component Value Date   HGBA1C 6.0 (H) 11/13/2019   No results found for: VITAMINB12 Lab Results  Component Value Date   TSH 1.63 06/23/2007      ASSESSMENT AND PLAN 63 y.o. year old female  has a past medical history of  Arthritis, Cancer (HCC), Diverticulitis, FH: CAD (coronary artery disease), HTN (hypertension), Hypercholesterolemia, and Migraine. here with:  Migraine headaches  -Continue Ajovy  monthly injection we will leave a sample upfront for her to use for this next month.  She will be switching to a new insurance next month - Advised that if Ajovy  is not approved on her new insurance we could consider Emgality. - Keep follow-up in 1 year with Dr. Darleen Duwaine Russell, MSN, NP-C 04/18/2024, 2:51 PM [facility] [address] [place], [place] [address] [phone]  The patient's condition requires frequent monitoring and adjustments in the treatment plan, reflecting the ongoing complexity of care.  This provider is the continuing focal point for all needed services for this condition.

## 2024-06-08 NOTE — Telephone Encounter (Signed)
 Called and left voicemail for patient to reschedule appointment on 04/18/2025 with Dr Ines.  If patient calls back, they can be rescheduled with Dr Buck

## 2024-10-24 ENCOUNTER — Ambulatory Visit: Admitting: Family Medicine

## 2025-04-18 ENCOUNTER — Ambulatory Visit: Admitting: Neurology
